# Patient Record
Sex: Male | Born: 1970
Health system: Southern US, Community
[De-identification: ages and names within clinical notes are randomized; demographics above are authoritative.]

## PROBLEM LIST (undated history)

## (undated) DIAGNOSIS — M549 Dorsalgia, unspecified: Secondary | ICD-10-CM

## (undated) DIAGNOSIS — D649 Anemia, unspecified: Secondary | ICD-10-CM

## (undated) DIAGNOSIS — G4730 Sleep apnea, unspecified: Secondary | ICD-10-CM

## (undated) DIAGNOSIS — G8929 Other chronic pain: Secondary | ICD-10-CM

## (undated) DIAGNOSIS — F319 Bipolar disorder, unspecified: Secondary | ICD-10-CM

## (undated) DIAGNOSIS — T7840XA Allergy, unspecified, initial encounter: Secondary | ICD-10-CM

## (undated) DIAGNOSIS — Z5189 Encounter for other specified aftercare: Secondary | ICD-10-CM

## (undated) HISTORY — DX: Anemia, unspecified: D64.9

## (undated) HISTORY — DX: Bipolar disorder, unspecified: F31.9

## (undated) HISTORY — DX: Other chronic pain: G89.29

## (undated) HISTORY — DX: Dorsalgia, unspecified: M54.9

## (undated) HISTORY — DX: Sleep apnea, unspecified: G47.30

## (undated) HISTORY — DX: Encounter for other specified aftercare: Z51.89

## (undated) HISTORY — DX: Allergy, unspecified, initial encounter: T78.40XA

---

## 2010-08-15 ENCOUNTER — Encounter: Payer: Self-pay | Admitting: Physician Assistant

## 2010-08-30 ENCOUNTER — Other Ambulatory Visit: Payer: Self-pay | Admitting: Orthopedic Surgery

## 2010-08-30 DIAGNOSIS — M79605 Pain in left leg: Secondary | ICD-10-CM

## 2010-08-30 DIAGNOSIS — M545 Low back pain: Secondary | ICD-10-CM

## 2010-08-31 ENCOUNTER — Ambulatory Visit
Admission: RE | Admit: 2010-08-31 | Discharge: 2010-08-31 | Disposition: A | Discharge status: Home / Self Care | Payer: 59 | Source: Ambulatory Visit | Attending: Orthopedic Surgery | Admitting: Orthopedic Surgery

## 2010-08-31 DIAGNOSIS — M545 Low back pain: Secondary | ICD-10-CM

## 2010-08-31 DIAGNOSIS — M79605 Pain in left leg: Secondary | ICD-10-CM

## 2012-01-05 ENCOUNTER — Emergency Department (HOSPITAL_BASED_OUTPATIENT_CLINIC_OR_DEPARTMENT_OTHER)
Admission: EM | Admit: 2012-01-05 | Discharge: 2012-01-05 | Disposition: A | Discharge status: Home / Self Care | Payer: Medicare Other | Attending: Emergency Medicine | Admitting: Emergency Medicine

## 2012-01-05 ENCOUNTER — Encounter (HOSPITAL_BASED_OUTPATIENT_CLINIC_OR_DEPARTMENT_OTHER): Payer: Self-pay | Admitting: Student

## 2012-01-05 DIAGNOSIS — R21 Rash and other nonspecific skin eruption: Secondary | ICD-10-CM | POA: Insufficient documentation

## 2012-01-05 DIAGNOSIS — L259 Unspecified contact dermatitis, unspecified cause: Secondary | ICD-10-CM

## 2012-01-05 MED ORDER — PREDNISONE 10 MG PO TABS: 20.0000 mg | ORAL_TABLET | Freq: Two times a day (BID) | ORAL | Status: DC

## 2012-01-05 MED ORDER — HYDROXYZINE HCL 25 MG PO TABS: 25.0000 mg | ORAL_TABLET | Freq: Four times a day (QID) | ORAL | Status: AC

## 2012-01-05 NOTE — Discharge Instructions (Signed)

## 2012-01-05 NOTE — ED Notes (Signed)
Poison ivy to both arms

## 2012-01-05 NOTE — ED Provider Notes (Signed)
History     CSN: [id number]  Arrival date & time 01/05/12  1044   First MD Initiated Contact with Patient 01/05/12 1057      Chief Complaint  Patient presents with  . Poison Ivy    (Consider location/radiation/quality/duration/timing/severity/associated sxs/prior treatment) HPI Comments: Was removing vines from side of house, now with rash and itching to arms, face.  Patient is a 41 y.o. male presenting with Poison Ivy. The history is provided by the patient.  Poison [place] This is a new problem. The current episode started 2 days ago. The problem occurs constantly. The problem has been rapidly worsening. Pertinent negatives include no shortness of breath. The symptoms are aggravated by nothing. The symptoms are relieved by nothing. Treatments tried: benadryl and hc cream. The treatment provided no relief.    History reviewed. No pertinent past medical history.  History reviewed. No pertinent past surgical history.  History reviewed. No pertinent family history.  History  Substance Use Topics  . Smoking status: Never Smoker   . Smokeless tobacco: Not on file  . Alcohol Use: No      Review of Systems  Respiratory: Negative for shortness of breath.   All other systems reviewed and are negative.    Allergies  Review of patient's allergies indicates no known allergies.  Home Medications  No current outpatient prescriptions on file.  BP 117/83  Pulse 70  Temp(Src) 97.7 F (36.5 C) (Oral)  Resp 18  SpO2 100%  Physical Exam  Nursing note and vitals reviewed. Constitutional: He is oriented to person, place, and time. He appears well-developed and well-nourished.  HENT:  Head: Normocephalic and atraumatic.  Neck: Normal range of motion. Neck supple.  Musculoskeletal: Normal range of motion.  Neurological: He is alert and oriented to person, place, and time.  Skin: Skin is warm and dry. He is not diaphoretic.       There is a macular rash to both forearms and face.       ED Course  Procedures (including critical care time)  Labs Reviewed - No data to display No results found.   No diagnosis found.    MDM  Will treat with prednisone and hydroxyzine.        Geoffery Lyons, MD 01/05/12 1106

## 2012-05-31 ENCOUNTER — Emergency Department (HOSPITAL_BASED_OUTPATIENT_CLINIC_OR_DEPARTMENT_OTHER): Payer: Medicare Other

## 2012-05-31 ENCOUNTER — Encounter (HOSPITAL_BASED_OUTPATIENT_CLINIC_OR_DEPARTMENT_OTHER): Payer: Self-pay

## 2012-05-31 ENCOUNTER — Emergency Department (HOSPITAL_BASED_OUTPATIENT_CLINIC_OR_DEPARTMENT_OTHER)
Admission: EM | Admit: 2012-05-31 | Discharge: 2012-05-31 | Disposition: A | Discharge status: Home / Self Care | Payer: Medicare Other | Attending: Emergency Medicine | Admitting: Emergency Medicine

## 2012-05-31 DIAGNOSIS — R109 Unspecified abdominal pain: Secondary | ICD-10-CM | POA: Insufficient documentation

## 2012-05-31 DIAGNOSIS — M25569 Pain in unspecified knee: Secondary | ICD-10-CM | POA: Diagnosis not present

## 2012-05-31 DIAGNOSIS — K7689 Other specified diseases of liver: Secondary | ICD-10-CM | POA: Diagnosis not present

## 2012-05-31 DIAGNOSIS — D7389 Other diseases of spleen: Secondary | ICD-10-CM | POA: Diagnosis not present

## 2012-05-31 LAB — CBC WITH DIFFERENTIAL/PLATELET
Basophils Relative: 0 % (ref 0–1)
Eosinophils Absolute: 0 10*3/uL (ref 0.0–0.7)
HCT: 40.8 % (ref 39.0–52.0)
Hemoglobin: 14.1 g/dL (ref 13.0–17.0)
Lymphs Abs: 2.9 10*3/uL (ref 0.7–4.0)
MCH: 30.7 pg (ref 26.0–34.0)
MCHC: 34.6 g/dL (ref 30.0–36.0)
MCV: 88.7 fL (ref 78.0–100.0)
Monocytes Absolute: 0.3 10*3/uL (ref 0.1–1.0)
Monocytes Relative: 5 % (ref 3–12)

## 2012-05-31 LAB — BASIC METABOLIC PANEL
BUN: 6 mg/dL (ref 6–23)
Creatinine, Ser: 0.9 mg/dL (ref 0.50–1.35)
GFR calc Af Amer: >90 mL/min (ref >90)
GFR calc non Af Amer: >90 mL/min (ref >90)
Glucose, Bld: 93 mg/dL (ref 70–99)

## 2012-05-31 LAB — URINALYSIS, ROUTINE W REFLEX MICROSCOPIC
Ketones, ur: NEGATIVE mg/dL
Leukocytes, UA: NEGATIVE
Nitrite: NEGATIVE
Protein, ur: NEGATIVE mg/dL
Urobilinogen, UA: 1 mg/dL (ref 0.0–1.0)

## 2012-05-31 LAB — HEPATIC FUNCTION PANEL
ALT: 10 U/L (ref 0–53)
Bilirubin, Direct: 0.1 mg/dL (ref 0.0–0.3)
Indirect Bilirubin: 0.5 mg/dL (ref 0.3–0.9)
Total Bilirubin: 0.6 mg/dL (ref 0.3–1.2)

## 2012-05-31 MED ORDER — KETOROLAC TROMETHAMINE 30 MG/ML IJ SOLN
30.0000 mg | Freq: Once | INTRAMUSCULAR | Status: AC
Start: 2012-05-31 — End: 2012-05-31
  Administered 2012-05-31: 30 mg via INTRAVENOUS
  Filled 2012-05-31: qty 1

## 2012-05-31 MED ORDER — ACETAMINOPHEN-CODEINE #3 300-30 MG PO TABS: 1.0000 | ORAL_TABLET | Freq: Four times a day (QID) | ORAL | Status: DC | PRN

## 2012-05-31 MED ORDER — GI COCKTAIL ~~LOC~~
30.0000 mL | Freq: Once | ORAL | Status: AC
  Administered 2012-05-31: 30 mL via ORAL
  Filled 2012-05-31: qty 30

## 2012-05-31 MED ORDER — IBUPROFEN 600 MG PO TABS: 600.0000 mg | ORAL_TABLET | Freq: Four times a day (QID) | ORAL | Status: DC | PRN

## 2012-05-31 MED ORDER — SODIUM CHLORIDE 0.9 % IV BOLUS (SEPSIS)
1000.0000 mL | Freq: Once | INTRAVENOUS | Status: AC
  Administered 2012-05-31: 1000 mL via INTRAVENOUS

## 2012-05-31 NOTE — ED Notes (Signed)
Pt reports intermittent right flank and abdominal pain x 1 week. He also reports left knee pain.

## 2012-05-31 NOTE — ED Provider Notes (Signed)
History     CSN: [id number]  Arrival date & time 05/31/12  1432   First MD Initiated Contact with Patient 05/31/12 1516      Chief Complaint  Patient presents with  . Abdominal Pain  . Knee Pain    (Consider location/radiation/quality/duration/timing/severity/associated sxs/prior treatment) HPI Comments: Pt with no known medical or surgical hx comes in with cc of abd pain and knee pain. Pt has right sided abd pain x 1-2 weeks. The pain is intermittent, but when he has it, it is extremelt disabling. The pain is described as colicky pain and sharp - stabbing type pain. No associated n/v/f/c/diarrhea. No UTI like sx. No hx of renal stones. Pt also has been having left knee pain - also intermittent, usually provoked with walking. No trauma precipitating it. No hx of knee pathology.  Patient is a 41 y.o. male presenting with abdominal pain and knee pain. The history is provided by the patient.  Abdominal Pain The primary symptoms of the illness include abdominal pain. The primary symptoms of the illness do not include fever, shortness of breath, nausea, vomiting, diarrhea or dysuria.  Symptoms associated with the illness do not include chills.  Knee Pain Associated symptoms include abdominal pain. Pertinent negatives include no chest pain, no headaches and no shortness of breath.    History reviewed. No pertinent past medical history.  History reviewed. No pertinent past surgical history.  No family history on file.  History  Substance Use Topics  . Smoking status: Never Smoker   . Smokeless tobacco: Not on file  . Alcohol Use: No      Review of Systems  Constitutional: Negative for fever, chills and activity change.  HENT: Negative for neck pain.   Eyes: Negative for visual disturbance.  Respiratory: Negative for cough, chest tightness and shortness of breath.   Cardiovascular: Negative for chest pain.  Gastrointestinal: Positive for abdominal pain. Negative for nausea,  vomiting, diarrhea, abdominal distention and rectal pain.  Genitourinary: Negative for dysuria, enuresis and difficulty urinating.  Musculoskeletal: Positive for arthralgias.  Neurological: Negative for dizziness, light-headedness and headaches.  Psychiatric/Behavioral: Negative for confusion.    Allergies  Review of patient's allergies indicates no known allergies.  Home Medications  No current outpatient prescriptions on file.  BP 128/83  Pulse 57  Temp 97.8 F (36.6 C) (Oral)  Resp 18  Ht 5\' 11"  (1.803 m)  Wt 200 lb (90.719 kg)  BMI 27.89 kg/m2  SpO2 100%  Physical Exam  Nursing note and vitals reviewed. Constitutional: He is oriented to person, place, and time. He appears well-developed.  HENT:  Head: Normocephalic and atraumatic.  Eyes: Conjunctivae normal and EOM are normal. Pupils are equal, round, and reactive to light.  Neck: Normal range of motion. Neck supple.  Cardiovascular: Normal rate and regular rhythm.   Pulmonary/Chest: Effort normal and breath sounds normal.  Abdominal: Soft. Bowel sounds are normal. He exhibits no distension. There is no tenderness. There is no rebound and no guarding.  Musculoskeletal:       Pt has no knee swelling, no erythema, no tenderness with palpation, but with active extension and flexion - pt has some medial anterior and popliteal region. No calf tenderness.  Neurological: He is alert and oriented to person, place, and time.  Skin: Skin is warm.    ED Course  Procedures (including critical care time)  Labs Reviewed  URINALYSIS, ROUTINE W REFLEX MICROSCOPIC - Abnormal; Notable for the following:    APPearance CLOUDY (*)  All other components within normal limits  CBC WITH DIFFERENTIAL - Abnormal; Notable for the following:    Lymphocytes Relative 47 (*)     All other components within normal limits  BASIC METABOLIC PANEL  HEPATIC FUNCTION PANEL  LIPASE, BLOOD   No results found.   No diagnosis found.    MDM    Pt comes in with cc of abd pain and knee pain.  Abd pain - will get Ct renal stone protocol. Exam is non peritoneal otherwise. Will get basic labs as well. Pt is from [place], family hx is benign, he has no rash, weight loss, no family hx of IBD, no diarrhea. Knee pain - unsure what the cause is. We will get knee Xray to make sure there is no osteophytes. No concerns for infection.        Derwood Kaplan, MD 05/31/12 [phone]

## 2012-07-05 DIAGNOSIS — L02219 Cutaneous abscess of trunk, unspecified: Secondary | ICD-10-CM | POA: Diagnosis not present

## 2012-07-05 DIAGNOSIS — L03319 Cellulitis of trunk, unspecified: Secondary | ICD-10-CM | POA: Diagnosis not present

## 2012-07-05 DIAGNOSIS — R599 Enlarged lymph nodes, unspecified: Secondary | ICD-10-CM | POA: Diagnosis not present

## 2012-07-05 DIAGNOSIS — M25569 Pain in unspecified knee: Secondary | ICD-10-CM | POA: Diagnosis not present

## 2012-09-09 ENCOUNTER — Ambulatory Visit (INDEPENDENT_AMBULATORY_CARE_PROVIDER_SITE_OTHER): Payer: Medicare Other | Admitting: Emergency Medicine

## 2012-09-09 VITALS — BP 130/80 | HR 70 | Temp 98.4°F | Resp 18 | Ht 69.0 in | Wt 209.0 lb

## 2012-09-09 DIAGNOSIS — N498 Inflammatory disorders of other specified male genital organs: Secondary | ICD-10-CM

## 2012-09-09 DIAGNOSIS — L02219 Cutaneous abscess of trunk, unspecified: Secondary | ICD-10-CM

## 2012-09-09 DIAGNOSIS — N492 Inflammatory disorders of scrotum: Secondary | ICD-10-CM

## 2012-09-09 MED ORDER — DOXYCYCLINE HYCLATE 100 MG PO CAPS: 100.0000 mg | ORAL_CAPSULE | Freq: Two times a day (BID) | ORAL | Status: DC

## 2012-09-09 NOTE — Progress Notes (Signed)
[facility] [address] [place] [address] [phone]  Date:  09/09/2012   Name:  Kishaun Erekson   DOB:  1970/09/01   MRN:  [id number]  PCP:  No primary provider on file.    Chief Complaint: Rash   History of Present Illness:  Ryan [name] is a 42 y.o. very pleasant male patient who presents with the following:  Has numerous lesions axillae that are painful and tender.  No fever or chills.  Girlfriend has MRSA  There is no problem list on file for this patient.   Past Medical History  Diagnosis Date  . Allergy     History reviewed. No pertinent past surgical history.  History  Substance Use Topics  . Smoking status: Former Games developer  . Smokeless tobacco: Not on file  . Alcohol Use: No    No family history on file.  No Known Allergies  Medication list has been reviewed and updated.  No current outpatient prescriptions on file prior to visit.   No current facility-administered medications on file prior to visit.    Review of Systems:  As per HPI, otherwise negative.    Physical Examination: Filed Vitals:   09/09/12 1240  BP: 130/80  Pulse: 70  Temp: 98.4 F (36.9 C)  Resp: 18   Filed Vitals:   09/09/12 1240  Height: 5\' 9"  (1.753 m)  Weight: 209 lb (94.802 kg)   Body mass index is 30.85 kg/(m^2). Ideal Body Weight: Weight in (lb) to have BMI = 25: 168.9   GEN: WDWN, NAD, Non-toxic, Alert & Oriented x 3 HEENT: Atraumatic, Normocephalic.  Ears and Nose: No external deformity. EXTR: No clubbing/cyanosis/edema NEURO: Normal gait.  PSYCH: Normally interactive. Conversant. Not depressed or anxious appearing.  Calm demeanor.  AXILLAE:  Multiple abscesses Genitalia:  Single abscess left scrotum  Assessment and Plan: Abscesses trunk and scrotum Doxycycline Follow up as needed in two weeks  Carmelina Dane, MD

## 2012-11-06 ENCOUNTER — Ambulatory Visit (INDEPENDENT_AMBULATORY_CARE_PROVIDER_SITE_OTHER): Payer: Medicare Other | Admitting: Family Medicine

## 2012-11-06 VITALS — BP 150/92 | HR 70 | Temp 98.2°F | Resp 16 | Ht 69.75 in | Wt 208.0 lb

## 2012-11-06 DIAGNOSIS — L02439 Carbuncle of limb, unspecified: Secondary | ICD-10-CM | POA: Diagnosis not present

## 2012-11-06 DIAGNOSIS — M79609 Pain in unspecified limb: Secondary | ICD-10-CM | POA: Diagnosis not present

## 2012-11-06 DIAGNOSIS — L02429 Furuncle of limb, unspecified: Secondary | ICD-10-CM | POA: Diagnosis not present

## 2012-11-06 DIAGNOSIS — M79622 Pain in left upper arm: Secondary | ICD-10-CM

## 2012-11-06 DIAGNOSIS — L02432 Carbuncle of left axilla: Secondary | ICD-10-CM

## 2012-11-06 MED ORDER — DOXYCYCLINE HYCLATE 100 MG PO CAPS: 100.0000 mg | ORAL_CAPSULE | Freq: Two times a day (BID) | ORAL | Status: DC

## 2012-11-06 NOTE — Patient Instructions (Addendum)
Pain, axillary, left  Carbuncle of left axilla - Plan: doxycycline (VIBRAMYCIN) 100 MG capsule, Wound culture    1. RETURN ON Thursday, 4/10 FROM 5-8 FOR WOUND RECHECK. 2.  KEEP WOUND COVERED.  CHANGE BANDAGE 1-2 TIMES DAILY. 3.  START ANTIBIOTICS/DOXYCYCLINE.

## 2012-11-06 NOTE — Progress Notes (Signed)
   [address]   [place], [place]  [address]   [phone]  Subjective:    Patient ID: Ryan [name], male    DOB: 1970-09-04, 42 y.o.   MRN: [id number]  HPI This 42 y.o. male presents for evaluation of abscess.  Onset four days ago.  No fever/chills/sweats.   No drainage.  Painful.  Unable to sleep.  History of recurrent boils R axilla and scrotal area; Doxycycline prescribed and worked well.  No heat.  Nursing, Holiday representative; unemployed currently.  Recent boil upper back; was very painful.   Review of Systems  Constitutional: Negative for fever, chills, diaphoresis and fatigue.  Skin: Positive for color change and wound.        Past Medical History  Diagnosis Date  . Allergy   . Sleep apnea     History reviewed. No pertinent past surgical history.  Prior to Admission medications   Not on File    No Known Allergies  History   Social History  . Marital Status: Single    Spouse Name: N/A    Number of Children: N/A  . Years of Education: N/A   Occupational History  . Not on file.   Social History Main Topics  . Smoking status: Former Games developer  . Smokeless tobacco: Not on file  . Alcohol Use: No  . Drug Use: No  . Sexually Active: Yes    Birth Control/ Protection: Condom   Other Topics Concern  . Not on file   Social History Narrative   Marital : single; from [place]; moved to [place] in 1999.      Children:  2      Employment: Photographer; unemployed currently.          History reviewed. No pertinent family history.  Objective:   Physical Exam  Nursing note and vitals reviewed. Constitutional: He appears well-developed and well-nourished. No distress.  Skin: He is not diaphoretic. There is erythema.  L AXILLA:  1.5 CM DIAMETER PALPABLE AREA OF INDURATION ASSOCIATED WITH 3 CM AREA OF SURROUNDING ERYTHEMA; +MILD FLUCTUANTS.  NO PUSTULE OR VESICLES ASSOCIATED.     PROCEDURE:  VERBAL CONSENT OBTAINED; 4 CC OF LIDOCAINE WITH EPI 2% ADMINISTERED INTO  WOUND OF L AXILLA; 11 BLADE ADVANCED INTO WOUND; INCISION MADE; WHITE DRAINAGE EXPRESSED; WOUND CULTURE OBTAINED.  HEMOSTATS EXPLORED WOUND WITH MINIMAL DRAINAGE; PLAIN GAUZE ADVANCED INTO WOUND.  PT TOLERATED PROCEDURE WELL; BANDAGE APPLIED.  GOOD HEMOSTASIS.     Assessment & Plan:  Pain, axillary, left  Carbuncle of left axilla  1.  Pain L axillary:  New. Secondary to carbuncle.  Recommend Tylenol or Motrin. 2.  L axillary carbuncle/abscess:  New.  S/p I&D with packing placed.  Rx for Doxcycycline provided; send wound culture.  RTC 36 hours for packing removal, wound recheck.  Keep clean and covered.  Meds ordered this encounter  Medications  . doxycycline (VIBRAMYCIN) 100 MG capsule    Sig: Take 1 capsule (100 mg total) by mouth 2 (two) times daily.    Dispense:  20 capsule    Refill:  0

## 2012-11-08 LAB — WOUND CULTURE

## 2012-11-16 ENCOUNTER — Encounter: Payer: Self-pay | Admitting: Radiology

## 2014-11-25 DIAGNOSIS — G8929 Other chronic pain: Secondary | ICD-10-CM | POA: Diagnosis not present

## 2014-11-25 DIAGNOSIS — N508 Other specified disorders of male genital organs: Secondary | ICD-10-CM | POA: Diagnosis not present

## 2014-11-25 DIAGNOSIS — Z87891 Personal history of nicotine dependence: Secondary | ICD-10-CM | POA: Diagnosis not present

## 2014-11-25 DIAGNOSIS — N4340 Spermatocele of epididymis, unspecified: Secondary | ICD-10-CM | POA: Diagnosis not present

## 2014-11-25 NOTE — ED Notes (Signed)
Pt states that he noticed two lumps come up near right sided testicle two days ago, denies pain,

## 2014-11-26 ENCOUNTER — Encounter (HOSPITAL_BASED_OUTPATIENT_CLINIC_OR_DEPARTMENT_OTHER): Payer: Self-pay | Admitting: Emergency Medicine

## 2014-11-26 ENCOUNTER — Emergency Department (HOSPITAL_BASED_OUTPATIENT_CLINIC_OR_DEPARTMENT_OTHER)
Admission: EM | Admit: 2014-11-26 | Discharge: 2014-11-26 | Disposition: A | Discharge status: Home / Self Care | Payer: Medicare Other | Attending: Emergency Medicine | Admitting: Emergency Medicine

## 2014-11-26 ENCOUNTER — Emergency Department (HOSPITAL_BASED_OUTPATIENT_CLINIC_OR_DEPARTMENT_OTHER): Payer: Medicare Other

## 2014-11-26 DIAGNOSIS — N4340 Spermatocele of epididymis, unspecified: Secondary | ICD-10-CM

## 2014-11-26 DIAGNOSIS — N508 Other specified disorders of male genital organs: Secondary | ICD-10-CM | POA: Diagnosis not present

## 2014-11-26 DIAGNOSIS — R52 Pain, unspecified: Secondary | ICD-10-CM

## 2014-11-26 DIAGNOSIS — N50819 Testicular pain, unspecified: Secondary | ICD-10-CM

## 2014-11-26 LAB — URINE MICROSCOPIC-ADD ON

## 2014-11-26 LAB — URINALYSIS, ROUTINE W REFLEX MICROSCOPIC
BILIRUBIN URINE: NEGATIVE
Glucose, UA: NEGATIVE mg/dL
Hgb urine dipstick: NEGATIVE
KETONES UR: 15 mg/dL — AB
Leukocytes, UA: NEGATIVE
NITRITE: NEGATIVE
PH: 5.5 (ref 5.0–8.0)
PROTEIN: 30 mg/dL — AB
Specific Gravity, Urine: 1.031 — ABNORMAL HIGH (ref 1.005–1.030)
UROBILINOGEN UA: 0.2 mg/dL (ref 0.0–1.0)

## 2014-11-26 MED ORDER — IBUPROFEN 800 MG PO TABS
800.0000 mg | ORAL_TABLET | Freq: Once | ORAL | Status: AC
  Administered 2014-11-26: 800 mg via ORAL
  Filled 2014-11-26: qty 1

## 2014-11-26 MED ORDER — AZITHROMYCIN 1 G PO PACK
1.0000 g | PACK | Freq: Once | ORAL | Status: AC
  Administered 2014-11-26: 1 g via ORAL
  Filled 2014-11-26: qty 1

## 2014-11-26 MED ORDER — CEFTRIAXONE SODIUM 250 MG IJ SOLR
250.0000 mg | Freq: Once | INTRAMUSCULAR | Status: AC
  Administered 2014-11-26: 250 mg via INTRAMUSCULAR
  Filled 2014-11-26: qty 250

## 2014-11-26 NOTE — ED Notes (Signed)
Pt reports that he has had recent new partners, but uses condoms

## 2014-11-26 NOTE — ED Provider Notes (Signed)
CSN: [id number]     Arrival date & time 11/25/14  2350 History   First MD Initiated Contact with Patient 11/26/14 0015     Chief Complaint  Patient presents with  . Testicle Pain     (Consider location/radiation/quality/duration/timing/severity/associated sxs/prior Treatment) Patient is a 44 y.o. male presenting with testicular pain. The history is provided by the patient.  Testicle Pain This is a new problem. The current episode started more than 2 days ago. The problem occurs constantly. The problem has not changed since onset.Pertinent negatives include no chest pain, no abdominal pain, no headaches and no shortness of breath. Nothing aggravates the symptoms. Nothing relieves the symptoms. He has tried nothing for the symptoms. The treatment provided no relief.  Has had new partners but denies penile discharge.  Is a long distance truck driver  Past Medical History  Diagnosis Date  . Allergy   . Sleep apnea   . Chronic back pain     Lower back injury with coccyx dysfunction; disability.   History reviewed. No pertinent past surgical history. History reviewed. No pertinent family history. History  Substance Use Topics  . Smoking status: Former Research scientist (life sciences)  . Smokeless tobacco: Not on file  . Alcohol Use: No    Review of Systems  Respiratory: Negative for shortness of breath.   Cardiovascular: Negative for chest pain.  Gastrointestinal: Negative for abdominal pain.  Genitourinary: Positive for testicular pain. Negative for flank pain, discharge and genital sores.  Neurological: Negative for headaches.  All other systems reviewed and are negative.     Allergies  Review of patient's allergies indicates no known allergies.  Home Medications   Prior to Admission medications   Not on File   BP 110/97 mmHg  Pulse 78  Temp(Src) 98.8 F (37.1 C) (Oral)  Resp 18  Ht 5\' 11"  (1.803 m)  Wt 200 lb (90.719 kg)  BMI 27.91 kg/m2  SpO2 100% Physical Exam  Constitutional: He is  oriented to person, place, and time. He appears well-developed and well-nourished. No distress.  HENT:  Head: Normocephalic and atraumatic.  Mouth/Throat: Oropharynx is clear and moist.  Eyes: Conjunctivae are normal. Pupils are equal, round, and reactive to light.  Neck: Normal range of motion. Neck supple.  No cervical, supraclavicular, no axillary no popliteal LAN.  Isolated right groin node freely mobile.  No left groin LAN  Cardiovascular: Normal rate, regular rhythm and intact distal pulses.   Pulmonary/Chest: Effort normal and breath sounds normal. No respiratory distress. He has no wheezes. He has no rales.  Abdominal: Soft. Bowel sounds are normal. There is no tenderness. There is no rebound and no guarding.  Genitourinary:  Uncircumcised no abscesses scrotum is normal  Chaperone present  Musculoskeletal: Normal range of motion.  Lymphadenopathy:    He has no cervical adenopathy.  Neurological: He is alert and oriented to person, place, and time.  Skin: Skin is warm and dry.  Psychiatric: He has a normal mood and affect.    ED Course  Procedures (including critical care time) Labs Review Labs Reviewed  URINALYSIS, ROUTINE W REFLEX MICROSCOPIC - Abnormal; Notable for the following:    Color, Urine AMBER (*)    Specific Gravity, Urine 1.031 (*)    Ketones, ur 15 (*)    Protein, ur 30 (*)    All other components within normal limits  URINE MICROSCOPIC-ADD ON    Imaging Review US Scrotum  11/26/2014   CLINICAL DATA:  43 year old male with painful right scrotal lump  EXAM: SCROTAL ULTRASOUND  DOPPLER ULTRASOUND OF THE TESTICLES  TECHNIQUE: Complete ultrasound examination of the testicles, epididymis, and other scrotal structures was performed. Color and spectral Doppler ultrasound were also utilized to evaluate blood flow to the testicles.  COMPARISON:  Prior CT abdomen/pelvis 05/31/2012  FINDINGS: Right testicle  Measurements: 4.1 x 2.5 x 2.6 cm. No mass or microlithiasis  visualized.  Left testicle  Measurements: 4.3 x 2.4 x 2.9 cm. No mass or microlithiasis visualized.  Pulsed Doppler interrogation of both testes demonstrates normal low resistance arterial and venous waveforms bilaterally.  Right epididymis: Sonographically simple spermatocele versus epididymal cyst measures of 4 mm.  Left epididymis: Sonographically simple spermatocele versus epididymal cyst measures 5 mm.  Hydrocele:  Trace hydrocele bilaterally.  Varicocele:  None visualized.  Sonographic interrogation of the region of clinical concern in the right groin corresponds with hypoechoic and a mildly prominent superficial inguinal lymphadenopathy.  IMPRESSION: 1. No evidence of testicular torsion, mass or other acute abnormality. 2. The region of clinical concern corresponds with mildly prominent and hypervascular superficial inguinal lymphadenopathy. Lymphadenopathy is likely reactive given tenderness. 3. Small bilateral sonographically simple spermatoceles versus epididymal cysts.   Electronically Signed   By: Jacqulynn Cadet M.D.   On: 11/26/2014 01:05   [place] Art/ven Flow Abd Pelv Doppler  11/26/2014   CLINICAL DATA:  44 year old male with painful right scrotal lump  EXAM: SCROTAL ULTRASOUND  DOPPLER ULTRASOUND OF THE TESTICLES  TECHNIQUE: Complete ultrasound examination of the testicles, epididymis, and other scrotal structures was performed. Color and spectral Doppler ultrasound were also utilized to evaluate blood flow to the testicles.  COMPARISON:  Prior CT abdomen/pelvis 05/31/2012  FINDINGS: Right testicle  Measurements: 4.1 x 2.5 x 2.6 cm. No mass or microlithiasis visualized.  Left testicle  Measurements: 4.3 x 2.4 x 2.9 cm. No mass or microlithiasis visualized.  Pulsed Doppler interrogation of both testes demonstrates normal low resistance arterial and venous waveforms bilaterally.  Right epididymis: Sonographically simple spermatocele versus epididymal cyst measures of 4 mm.  Left epididymis:  Sonographically simple spermatocele versus epididymal cyst measures 5 mm.  Hydrocele:  Trace hydrocele bilaterally.  Varicocele:  None visualized.  Sonographic interrogation of the region of clinical concern in the right groin corresponds with hypoechoic and a mildly prominent superficial inguinal lymphadenopathy.  IMPRESSION: 1. No evidence of testicular torsion, mass or other acute abnormality. 2. The region of clinical concern corresponds with mildly prominent and hypervascular superficial inguinal lymphadenopathy. Lymphadenopathy is likely reactive given tenderness. 3. Small bilateral sonographically simple spermatoceles versus epididymal cysts.   Electronically Signed   By: Jacqulynn Cadet M.D.   On: 11/26/2014 01:05     EKG Interpretation None      MDM   Final diagnoses:  Pain  Testicular pain    Will treat for STI.  Has a spermatocele.  Will refer to urology.     Veatrice Kells, MD 11/26/14 [phone]

## 2015-01-12 ENCOUNTER — Ambulatory Visit: Payer: Medicare Other | Admitting: Family

## 2015-01-13 ENCOUNTER — Encounter: Payer: Self-pay | Admitting: Medical

## 2015-01-13 ENCOUNTER — Ambulatory Visit (INDEPENDENT_AMBULATORY_CARE_PROVIDER_SITE_OTHER): Payer: Medicare Other | Admitting: Medical

## 2015-01-13 VITALS — BP 120/83 | HR 81 | Temp 98.2°F | Ht 69.75 in | Wt 206.6 lb

## 2015-01-13 DIAGNOSIS — F319 Bipolar disorder, unspecified: Secondary | ICD-10-CM | POA: Insufficient documentation

## 2015-01-13 DIAGNOSIS — G4700 Insomnia, unspecified: Secondary | ICD-10-CM

## 2015-01-13 DIAGNOSIS — Z8659 Personal history of other mental and behavioral disorders: Secondary | ICD-10-CM | POA: Diagnosis not present

## 2015-01-13 DIAGNOSIS — J301 Allergic rhinitis due to pollen: Secondary | ICD-10-CM | POA: Diagnosis not present

## 2015-01-13 DIAGNOSIS — D649 Anemia, unspecified: Secondary | ICD-10-CM | POA: Diagnosis not present

## 2015-01-13 DIAGNOSIS — G4730 Sleep apnea, unspecified: Secondary | ICD-10-CM

## 2015-01-13 DIAGNOSIS — M549 Dorsalgia, unspecified: Secondary | ICD-10-CM | POA: Insufficient documentation

## 2015-01-13 DIAGNOSIS — M545 Low back pain: Secondary | ICD-10-CM

## 2015-01-13 DIAGNOSIS — J309 Allergic rhinitis, unspecified: Secondary | ICD-10-CM | POA: Insufficient documentation

## 2015-01-13 NOTE — Patient Instructions (Addendum)
For your allergies continue claritin when season comes.   For your sleep apnea. Would recommend referral to specialist again. Equipment and mask may be more comfortable now. And if 02 sats drop then you will constantly awaken.  Will get old records and ask you sign form today as well as release form.  If you have acute flare of bipolar then ED evaluation. I do think referral to specialist would be helpful to find medication that has least side effects.   For back pain will review old records and may need to get lumbar spine xray.  Follow up in 1wk or as needed. Will plan to get cbc. May get other labs as well.

## 2015-01-13 NOTE — Assessment & Plan Note (Signed)
Currently stable and not on medications. This is his primary issue per his report that led to disability. No homicidal or suicidal ideations. Does not report depression and does not appear manic as well today(Although speaking with him it does appear he has psychiatric condition). Pt has lost his disability. And I don't have records. I have form he request for [place] to fill out. I will  try to get records from [facility]. Also will try to get records from prior pcp. If I am unable to get mental health records due to privacy laws then may need to refer to psychiatrist. Since bipolar is his primary dx that led to his disability.

## 2015-01-13 NOTE — Assessment & Plan Note (Signed)
Plan to get cbc on next visit.

## 2015-01-13 NOTE — Assessment & Plan Note (Addendum)
For back pain will review old records and may need to get lumbar spine xray.  Note pt reports this is secondary reason he has disability.

## 2015-01-13 NOTE — Assessment & Plan Note (Signed)
For your allergies continue claritin when season comes

## 2015-01-13 NOTE — Assessment & Plan Note (Signed)
For your sleep apnea. Would recommend referral to specialist again. Equipment and mask may be more comfortable now. And if 02 sats drop then you will constantly awaken.

## 2015-01-13 NOTE — Progress Notes (Signed)
Subjective:    Patient ID: Ryan [name], male    DOB: May 01, 1971, 44 y.o.   MRN: [id number]  HPI  I have reviewed pt PMH, PSH, FH, Social History and Surgical History  Allergies- spring and fall. claritin when has.  Blood transfusion- When he was very young had malnutrition and got transfusion.  Anemia- when younger. 3 years ago his hb/hct looked good.  Sleep apnea- Pt dx 2007.(Pt had pulmonologist in the past.) Pt stopped using his machine in the past. He did not feel like he slept better with the machine.   Insomia- has problems going to sleep and staying asleep.  Back pain- years ago and some today.- Coccyx region pain. He states dx with tear in region.  Pt has no surgeries.  Former very light smoker 44 yo.  Pt also has bipolar disorder. Pt sees [facility]. Facility was  In [place]. Pt not on any medications presently. Pt was off medications for a while. But in past medications were expensive. And he would gain weight. He can't remember what medication he was on.      Pt not working. 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   Pt also has some forms regarding social security and disabilitiy. Pt had disability. Dx that qualified him was bipolar and degnerative back disease. Pt former MD was  Dr. Harrington Challenger.  He states his formrer pcp retired. He has paperwork to be filled out. The paper work states he was supposed to have this all filled out by May 2016 or he would loose disability. In fact he tells me he has already lost his funds.          Review of Systems  Constitutional: Negative for fever, chills and fatigue.  Respiratory: Negative for cough, shortness of breath and wheezing.   Genitourinary: Negative for urgency, frequency, hematuria, flank pain, enuresis and difficulty urinating.  Musculoskeletal: Positive for back pain.  Neurological: Negative for weakness and numbness.  Hematological: Negative for  adenopathy. Does not bruise/bleed easily.  Psychiatric/Behavioral: Positive for sleep disturbance. Negative for hallucinations, behavioral problems, dysphoric mood, decreased concentration and agitation. The patient is not nervous/anxious.        No suicidal ideations.       Objective:   Physical Exam  General  Mental Status - Alert. General Appearance - Well groomed. Not in acute distress.  Skin Rashes- No Rashes.  HEENT Head- Normal. Ear Auditory Canal - Left- Normal. Right - Normal.Tympanic Membrane- Left- Normal. Right- Normal. Eye Sclera/Conjunctiva- Left- Normal. Right- Normal. Nose & Sinuses Nasal Mucosa- Left-  Not oggy or Congested. Right-  Not  boggy or Congested. Mouth & Throat Lips: Upper Lip- Normal: no dryness, cracking, pallor, cyanosis, or vesicular eruption. Lower Lip-Normal: no dryness, cracking, pallor, cyanosis or vesicular eruption. Buccal Mucosa- Bilateral- No Aphthous ulcers. Oropharynx- No Discharge or Erythema. Tonsils: Characteristics- Bilateral- No Erythema or Congestion. Size/Enlargement- Bilateral- No enlargement. Discharge- bilateral-None.  Neck Neck- Supple. No Masses.   Chest and Lung Exam Auscultation: Breath Sounds:- even and unlabored, but bilateral upper lobe rhonchi.  Cardiovascular Auscultation:Rythm- Regular, rate and rhythm. Murmurs & Other Heart Sounds:Ausculatation of the heart reveal- No Murmurs.  Lymphatic Head & Neck General Head & Neck Lymphatics: Bilateral: Description- No Localized lymphadenopathy.    Back Mid lumbar spine tenderness to palpation. Pain on straight leg lift. Pain on lateral movements and flexion/extension of the spine.  Lower ext neurologic  L5-S1 sensation intact bilaterally. Normal patellar reflexes bilaterally. No foot drop bilaterally.       Assessment & Plan:  Pt benefits have been stopped. He is trying to get his benefits restarted.

## 2015-01-13 NOTE — Progress Notes (Signed)
Pre visit review using our clinic review tool, if applicable. No additional management support is needed unless otherwise documented below in the visit note. 

## 2015-01-28 ENCOUNTER — Encounter: Payer: Self-pay | Admitting: Medical

## 2015-01-28 ENCOUNTER — Ambulatory Visit (INDEPENDENT_AMBULATORY_CARE_PROVIDER_SITE_OTHER): Payer: Medicare Other | Admitting: Medical

## 2015-01-28 VITALS — BP 147/91 | HR 57 | Temp 98.0°F | Ht 69.75 in | Wt 207.6 lb

## 2015-01-28 DIAGNOSIS — R030 Elevated blood-pressure reading, without diagnosis of hypertension: Secondary | ICD-10-CM

## 2015-01-28 DIAGNOSIS — IMO0001 Reserved for inherently not codable concepts without codable children: Secondary | ICD-10-CM

## 2015-01-28 DIAGNOSIS — Z8659 Personal history of other mental and behavioral disorders: Secondary | ICD-10-CM

## 2015-01-28 DIAGNOSIS — Z862 Personal history of diseases of the blood and blood-forming organs and certain disorders involving the immune mechanism: Secondary | ICD-10-CM

## 2015-01-28 LAB — COMPREHENSIVE METABOLIC PANEL WITH GFR
ALT: 17 U/L (ref 0–53)
AST: 22 U/L (ref 0–37)
Albumin: 4.2 g/dL (ref 3.5–5.2)
Alkaline Phosphatase: 52 U/L (ref 39–117)
BUN: 9 mg/dL (ref 6–23)
CO2: 26 meq/L (ref 19–32)
Calcium: 9.7 mg/dL (ref 8.4–10.5)
Chloride: 103 meq/L (ref 96–112)
Creatinine, Ser: 0.96 mg/dL (ref 0.40–1.50)
GFR: 91.41 mL/min
Glucose, Bld: 92 mg/dL (ref 70–99)
Potassium: 3.7 meq/L (ref 3.5–5.1)
Sodium: 139 meq/L (ref 135–145)
Total Bilirubin: 0.6 mg/dL (ref 0.2–1.2)
Total Protein: 8.2 g/dL (ref 6.0–8.3)

## 2015-01-28 LAB — CBC WITH DIFFERENTIAL/PLATELET
Basophils Absolute: 0 K/uL (ref 0.0–0.1)
Basophils Relative: 0.4 % (ref 0.0–3.0)
Eosinophils Absolute: 0 K/uL (ref 0.0–0.7)
Eosinophils Relative: 0.7 % (ref 0.0–5.0)
HCT: 46.4 % (ref 39.0–52.0)
Hemoglobin: 15.1 g/dL (ref 13.0–17.0)
Lymphocytes Relative: 34.1 % (ref 12.0–46.0)
Lymphs Abs: 2.3 K/uL (ref 0.7–4.0)
MCHC: 32.6 g/dL (ref 30.0–36.0)
MCV: 96.5 fl (ref 78.0–100.0)
Monocytes Absolute: 0.3 K/uL (ref 0.1–1.0)
Monocytes Relative: 4.7 % (ref 3.0–12.0)
Neutro Abs: 4 K/uL (ref 1.4–7.7)
Neutrophils Relative %: 60.1 % (ref 43.0–77.0)
Platelets: 444 K/uL — ABNORMAL HIGH (ref 150.0–400.0)
RBC: 4.81 Mil/uL (ref 4.22–5.81)
RDW: 14.4 % (ref 11.5–15.5)
WBC: 6.7 K/uL (ref 4.0–10.5)

## 2015-01-28 NOTE — Assessment & Plan Note (Signed)
You appear stable today. But I do want to get you in with psychiatrist again. I gave you [facility] information but I also gave you psychiatrist numbers for your to refer. I can try to refer you but often times psychiatrist office wants you to make the calls.   I talked with my supervising Physician and she is stating psychiatrist would have to fill out forms. She states you may need to contact disability lawyer to connect you with a disability Doctor.  I am willing to review your records and talk with your contact at disability/social security office regarding the process.

## 2015-01-28 NOTE — Patient Instructions (Addendum)
History of bipolar disorder You appear stable today. But I do want to get you in with psychiatrist again. I gave you [facility] information but I also gave you psychiatrist numbers for your to refer. I can try to refer you but often times psychiatrist office wants you to make the calls.   I talked with my supervising Physician and she is stating psychiatrist would have to fill out forms. She states you may need to contact disability lawyer to connect you with a disability Doctor.  I am willing to review your records and talk with your contact at disability/social security office regarding the process.  Then discuss this with my supervising physician  If you have an acute change in your mental status or mood then advise ED evaluation  For you blood pressure I want you to begin checkig bp daily and document reading. Low salt diet.   Follow up in one month for bp check.

## 2015-01-28 NOTE — Progress Notes (Signed)
Subjective:    Patient ID: Ryan [name], male    DOB: 02-Mar-1971, 44 y.o.   MRN: [id number]  HPI  Pt in for follow up. I don't have any information on his medical history as of yet. I don't have records from [facility]. Pt has history of disability. He lost his disability recently(His bipolar per his last note was the dx that qualified him for the disablity). Pt not on any medication currently for his bipolar disorder. No meds for years per his report.  Pt [facility] many years ago. During the exam he stated 20 yrs.  Pt was seeing psychiatrist every 3 years when he had disability. This was required. He states would see psychiatrist that government chose.  No cardiac or neuro signs or symptoms. His bp is more elevated than it was last time. He states from frustration dealing with trying to get disability back.   Review of Systems  Constitutional: Negative for fever, chills, diaphoresis, activity change and fatigue.  Respiratory: Negative for cough, chest tightness and shortness of breath.   Cardiovascular: Negative for chest pain, palpitations and leg swelling.  Gastrointestinal: Negative for nausea, vomiting and abdominal pain.  Musculoskeletal: Negative for neck pain and neck stiffness.  Neurological: Negative for dizziness, tremors, seizures, syncope, facial asymmetry, speech difficulty, weakness, light-headedness, numbness and headaches.  Psychiatric/Behavioral: Negative for suicidal ideas, hallucinations, behavioral problems, confusion, sleep disturbance, self-injury, decreased concentration and agitation. The patient is not nervous/anxious.        Not on any meds. States battle with mood(but not severe)    No homicidal or suicidal ideations.   Previously he had a lot side effects with meds. Gained a lot of weight.  He wants to consider being put on new meds. So I did try to refer him. I did not get his old records.     Past Medical History    Diagnosis Date  . Allergy   . Sleep apnea   . Chronic back pain     Lower back injury with coccyx dysfunction; disability.  . Anemia   . Blood transfusion without reported diagnosis     History   Social History  . Marital Status: Single    Spouse Name: N/A  . Number of Children: N/A  . Years of Education: N/A   Occupational History  . Not on file.   Social History Main Topics  . Smoking status: Former Research scientist (life sciences)  . Smokeless tobacco: Not on file  . Alcohol Use: No  . Drug Use: No  . Sexual Activity: Yes    Birth Control/ Protection: Condom   Other Topics Concern  . Not on file   Social History Narrative   Marital : single; from [place]; moved to [place] in 1999.      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          No past surgical history on file.  No family history on file.  No Known Allergies  No current outpatient prescriptions on file prior to visit.   No current facility-administered medications on file prior to visit.    BP 147/91 mmHg  Pulse 57  Temp(Src) 98 F (36.7 C) (Oral)  Ht 5' 9.75" (1.772 m)  Wt 207 lb 9.6 oz (94.167 kg)  BMI 29.99 kg/m2  SpO2 99%       Objective:   Physical Exam   General Mental  Status- Alert. General Appearance- Not in acute distress.   Skin General: Color- Normal Color. Moisture- Normal Moisture.  Neck Carotid Arteries- Normal color. Moisture- Normal Moisture. No carotid bruits. No JVD.  Chest and Lung Exam Auscultation: Breath Sounds:-Normal. CTA Cardiovascular Auscultation:Rythm- Regular, Rate and Rhythm. Murmurs & Other Heart Sounds:Auscultation of the heart reveals- No Murmurs.  Abdomen Inspection:-Inspeection Normal. Palpation/Percussion:Note:No mass. Palpation and Percussion of the abdomen reveal- Non Tender, Non Distended + BS, no rebound or guarding.    Neurologic Cranial Nerve exam:- CN III-XII intact(No  nystagmus), symmetric smile. Strength:- 5/5 equal and symmetric strength both upper and lower extremities.       Assessment & Plan:

## 2015-01-28 NOTE — Progress Notes (Signed)
Pre visit review using our clinic review tool, if applicable. No additional management support is needed unless otherwise documented below in the visit note. 

## 2015-02-01 ENCOUNTER — Telehealth: Payer: Self-pay | Admitting: Medical

## 2015-02-01 DIAGNOSIS — D473 Essential (hemorrhagic) thrombocythemia: Secondary | ICD-10-CM

## 2015-02-01 DIAGNOSIS — D75839 Thrombocytosis, unspecified: Secondary | ICD-10-CM

## 2015-02-01 NOTE — Telephone Encounter (Signed)
Repeat platlets due to elevation.

## 2015-02-02 NOTE — Telephone Encounter (Signed)
Left a message for call back.  

## 2015-02-03 NOTE — Telephone Encounter (Signed)
Lab appointment scheduled for/with patient.

## 2015-02-08 ENCOUNTER — Other Ambulatory Visit: Payer: Medicare Other

## 2015-03-01 ENCOUNTER — Telehealth: Payer: Self-pay | Admitting: Family

## 2015-03-01 ENCOUNTER — Encounter: Payer: Medicare Other | Admitting: Medical

## 2015-03-01 NOTE — Progress Notes (Signed)
This encounter was created in error - please disregard.

## 2015-03-01 NOTE — Telephone Encounter (Signed)
Relation to pt: self  Call back number:[phone]   Reason for call:   FYI-  Patient wanted PA to be informed he is extremely frustrated office has not received records spoke with previous doctor office and they faxed it and mailed it to the wrong office ([address] [facility] site)  Dr. Lacinda Axon psychiatrist from [facility] will be faxing and mailing patient records to our office.   Medical records #  [phone]

## 2015-03-02 ENCOUNTER — Telehealth: Payer: Self-pay | Admitting: Medical

## 2015-03-02 NOTE — Telephone Encounter (Signed)
Have not received yet

## 2015-03-02 NOTE — Telephone Encounter (Signed)
Pt would like to be notified as soon as we come across his medical records. He said he needs to be notified one way or the other about them being received because we cannot provide appropriate care without the records.

## 2015-03-02 NOTE — Telephone Encounter (Signed)
Pt called back again. He has rescheduled for 03/05/15, FYI.

## 2015-03-02 NOTE — Telephone Encounter (Signed)
Pt called in stating that he is frustrated and asked to speak to [place]. Advised pt Tiffany was with another pt and asked how I could help. Pt raised his voice stating that he doesn't want to keep explaining things to different people. Advised pt that we have notes documented and I can look back regarding concerns that he has contacted office about before. Pt hung up the phone on me.  Pt was also no show 03/01/15 9:15am, notes in from [place] yesterday but no documentation for reason of no show. She states he pt did not give a reason but called in yesterday frustrated about medical records.  Please advise on how to handle the situation with pt. Please advise on charge for no show 03/01/15.

## 2015-03-03 NOTE — Telephone Encounter (Addendum)
Patient call back to check on the status of received records, advised patient when received will call best [phone].

## 2015-03-05 ENCOUNTER — Ambulatory Visit (INDEPENDENT_AMBULATORY_CARE_PROVIDER_SITE_OTHER): Payer: Medicare Other | Admitting: Medical

## 2015-03-05 ENCOUNTER — Encounter: Payer: Self-pay | Admitting: Medical

## 2015-03-05 VITALS — BP 138/85 | HR 70 | Temp 98.0°F | Ht 69.75 in | Wt 204.4 lb

## 2015-03-05 DIAGNOSIS — Z8659 Personal history of other mental and behavioral disorders: Secondary | ICD-10-CM | POA: Diagnosis not present

## 2015-03-05 NOTE — Progress Notes (Signed)
before

## 2015-03-05 NOTE — Patient Instructions (Addendum)
History of bipolar disorder Still waiting old reports. Will review records. Pt understands after reviewing records may need to again recommend referral to psychiatrist. We can provide copies of medication that we may prescribe for social security purposes. But per Social security will not need any forms filled out by  any provider. All pt will do is sign paperwork Social Security will send him.   Will discuss any potential treatment with Supervising Physician due to his diagnosis.  Follow up date to be determined after I get old records.

## 2015-03-05 NOTE — Progress Notes (Signed)
Subjective:    Patient ID: Ryan [name], male    DOB: 1970-11-13, 45 y.o.   MRN: [id number]  HPI  Pt in for follow up. Pt records were sent to other office. So next week we anticipate records may be in.  Pt has not established psychiatrist care yet(as I requested). Pt has talked with social security. Pt has figured a way to get his disability re-activated. He just needs to sign the forms. He does not remember meds he was on in the past. He was on meds about 3-4 years ago.   Pt does have periods of mania in past. Also some down periods. He has done this past 4 yrs. He meditates to help. Has not been on meds.  Pt thinks he may have only beenon one med in the past.     Review of Systems  Constitutional: Negative for fever, chills and fatigue.  Respiratory: Negative for cough, chest tightness, shortness of breath and wheezing.   Cardiovascular: Negative for chest pain and palpitations.  Neurological: Negative for dizziness, speech difficulty, weakness, numbness and headaches.  Hematological: Negative for adenopathy. Does not bruise/bleed easily.  Psychiatric/Behavioral: Positive for dysphoric mood. Negative for behavioral problems and confusion. The patient is not nervous/anxious.        At times mild mania and some depression. But stable.   Also at times temper issues.       Past Medical History  Diagnosis Date  . Allergy   . Sleep apnea   . Chronic back pain     Lower back injury with coccyx dysfunction; disability.  . Anemia   . Blood transfusion without reported diagnosis     History   Social History  . Marital Status: Single    Spouse Name: N/A  . Number of Children: N/A  . Years of Education: N/A   Occupational History  . Not on file.   Social History Main Topics  . Smoking status: Former Research scientist (life sciences)  . Smokeless tobacco: Not on file  . Alcohol Use: No  . Drug Use: No  . Sexual Activity: Yes    Birth Control/ Protection: Condom   Other Topics Concern  .  Not on file   Social History Narrative   Marital : single; from [place]; moved to [place] in 1999.      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          No past surgical history on file.  No family history on file.  No Known Allergies  No current outpatient prescriptions on file prior to visit.   No current facility-administered medications on file prior to visit.    BP 138/85 mmHg  Pulse 70  Temp(Src) 98 F (36.7 C) (Oral)  Ht 5' 9.75" (1.772 m)  Wt 204 lb 6.4 oz (92.715 kg)  BMI 29.53 kg/m2  SpO2 100%       Objective:   Physical Exam  General- No acute distress. Good mood today. Stable. Neck- Full range of motion, no jvd Lungs- Clear, even and unlabored. Heart- regular rate and rhythm. Neurologic- CNII- XII grossly intact.       Assessment & Plan:  History of bipolar disorder Still waiting old reports. Will review records. Pt understands after reviewing records may need to again recommend referral to psychiatrist. We can provide copies of medication that we may prescribe for social security purposes. But per Social security will not need  any forms filled out by  any provider. All pt will do is sign paperwork Social Security will send him.  Will discuss any potential treatment with Supervising Physician due to his diagnosis.  Follow up date to be determined after I get old records.

## 2015-03-05 NOTE — Assessment & Plan Note (Addendum)
Still waiting old reports. Will review records. Pt understands after reviewing records may need to again recommend referral to psychiatrist. We can provide copies of medication that we may prescribe for social security purposes. But per Social security will not need any forms filled out by  any provider. All pt will do is sign paperwork Social Security will send him.

## 2015-03-08 NOTE — Telephone Encounter (Signed)
Charge for no show 03/01/15?

## 2015-03-09 NOTE — Telephone Encounter (Signed)
No charge. 

## 2015-03-17 NOTE — Telephone Encounter (Signed)
I have still not seen these records.

## 2015-03-23 NOTE — Telephone Encounter (Signed)
Spoke with Crystal from [employer] Record Department and she stated confirmation has been received stating we received records via fax and records mailed. Crystal stated they only keep records for a certain amount of time and records will be eventually discarded soon due to the time period. Requested if records can be faxed again to [phone] and [phone] and she said yes.

## 2015-03-23 NOTE — Telephone Encounter (Addendum)
Relation to AJ:LUNG  Call back number:[phone] Pharmacy:  Reason for call:  Patient called checking on the status of medical records and explained to patient have not received records and i will Nutritional therapist of [facility] Records Department to find out the status. Patient sated ok and informed him I will follow up with him to let him know the status.

## 2015-03-23 NOTE — Telephone Encounter (Signed)
Medical records received and scan into patient chart. Please advise

## 2015-03-24 NOTE — Telephone Encounter (Signed)
Called patient advised records have been received.

## 2015-03-29 ENCOUNTER — Telehealth: Payer: Self-pay | Admitting: Medical

## 2015-03-29 ENCOUNTER — Encounter: Payer: Medicare Other | Admitting: Medical

## 2015-03-29 NOTE — Telephone Encounter (Signed)
Looks like coming tomorrow. No charge. If no show tomorrow then will charge.

## 2015-03-29 NOTE — Telephone Encounter (Signed)
Pt no show today 03/29/15 10:15am, 1 month f/u appt, 8/29 10:12am pt called to find out appt time, states he can be here close to 11:00am, advised pt he needs to reschedule and nothing avail today, pt states that he did not receive [place] and [place] he called to confirm time now, pt states that we should be able to see him a little late, advised pt to reschedule, rescheduled for 03/29/14 8:15am, charge for no show?

## 2015-03-29 NOTE — Progress Notes (Signed)
This encounter was created in error - please disregard.

## 2015-03-30 ENCOUNTER — Ambulatory Visit (INDEPENDENT_AMBULATORY_CARE_PROVIDER_SITE_OTHER): Payer: Medicare Other | Admitting: Medical

## 2015-03-30 ENCOUNTER — Encounter: Payer: Self-pay | Admitting: Medical

## 2015-03-30 VITALS — BP 144/88 | HR 63 | Temp 98.1°F | Ht 69.75 in | Wt 204.4 lb

## 2015-03-30 DIAGNOSIS — Z8659 Personal history of other mental and behavioral disorders: Secondary | ICD-10-CM

## 2015-03-30 DIAGNOSIS — F32A Depression, unspecified: Secondary | ICD-10-CM

## 2015-03-30 DIAGNOSIS — F329 Major depressive disorder, single episode, unspecified: Secondary | ICD-10-CM | POA: Diagnosis not present

## 2015-03-30 MED ORDER — SERTRALINE HCL 25 MG PO TABS: 25.0000 mg | ORAL_TABLET | Freq: Every day | ORAL | Status: DC

## 2015-03-30 NOTE — Progress Notes (Signed)
Pre visit review using our clinic review tool, if applicable. No additional management support is needed unless otherwise documented below in the visit note. 

## 2015-03-30 NOTE — Patient Instructions (Signed)
With your history of bipolar and your intermittent depression, I will prescribe you low dose sertraline. This may help with your anxiety as well.  Your condition appears stable presently but with the hx of bipolar and potential for fluctuating moods I do want you evaluated by psychiatrist to see if they think mood stabilizer would be beneficial. You have history of risperidal use as well. I don't think this is necessary presently but will get psychiatrist opinion.   I want to see you in 2 wks and see how you are feeling on the low dose sertraline.  Follow up sooner if needed

## 2015-03-30 NOTE — Progress Notes (Signed)
Subjective:    Patient ID: Ryan [name], male    DOB: 1970-11-30, 44 y.o.   MRN: [id number]  HPI  I have been waiting for records and finally have them. Follow up visit. I have records finally. Summary of those records show he has hx of bipolar dx and some prior use of marijuana. But he is no longer using marijuana.   At one point was on sertraline and he was on Risperdal.  He stopped marijuana since he did not want to break the law.  In 2003 he had been off of meds for several years.   Pt social security status. They just need to know what meds  he was using.   Pt has been off medication for a couple of years. Pt states his mood varies from day to day. Some days feels fine. Other days more depressed. On discussion. He is not reporting any psychotic thoughts or any delusion.  Pt at times feels anxious often. Occasional mild agitation but he states controlls these symptoms.   Review of Systems  Constitutional: Negative for fever, chills and fatigue.  Respiratory: Negative for cough, chest tightness, shortness of breath and wheezing.   Cardiovascular: Negative for chest pain and palpitations.  Musculoskeletal: Negative for back pain.  Skin: Negative for rash.  Hematological: Negative for adenopathy. Does not bruise/bleed easily.  Psychiatric/Behavioral: Positive for dysphoric mood. Negative for suicidal ideas, behavioral problems, confusion, sleep disturbance, self-injury and decreased concentration. The patient is nervous/anxious.     Past Medical History  Diagnosis Date  . Allergy   . Sleep apnea   . Chronic back pain     Lower back injury with coccyx dysfunction; disability.  . Anemia   . Blood transfusion without reported diagnosis     Social History   Social History  . Marital Status: Single    Spouse Name: N/A  . Number of Children: N/A  . Years of Education: N/A   Occupational History  . Not on file.   Social History Main Topics  . Smoking status: Former  Research scientist (life sciences)  . Smokeless tobacco: Not on file  . Alcohol Use: No  . Drug Use: No  . Sexual Activity: Yes    Birth Control/ Protection: Condom   Other Topics Concern  . Not on file   Social History Narrative   Marital : single; from [place]; moved to [place] in 1999.      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          No past surgical history on file.  No family history on file.  No Known Allergies  No current outpatient prescriptions on file prior to visit.   No current facility-administered medications on file prior to visit.    BP 144/88 mmHg  Pulse 63  Temp(Src) 98.1 F (36.7 C) (Oral)  Ht 5' 9.75" (1.772 m)  Wt 204 lb 6.4 oz (92.715 kg)  BMI 29.53 kg/m2  SpO2 100%       Objective:   Physical Exam  General Mental Status- Alert. General Appearance- Not in acute distress.   Skin General: Color- Normal Color. Moisture- Normal Moisture.  Neck Carotid Arteries- Normal color. Moisture- Normal Moisture. No carotid bruits. No JVD.  Chest and Lung Exam Auscultation: Breath Sounds:-Normal.  Cardiovascular Auscultation:Rythm- Regular. Murmurs & Other Heart Sounds:Auscultation of the heart reveals- No Murmurs.  Abdomen Inspection:-Inspeection Normal. Palpation/Percussion:Note:No mass. Palpation and Percussion of the  abdomen reveal- Non Tender, Non Distended + BS, no rebound or guarding.    Neurologic Cranial Nerve exam:- CN III-XII intact(No nystagmus), symmetric smile. Strength:- 5/5 equal and symmetric strength both upper and lower extremities.     Assessment & Plan:  With your history of bipolar and your intermittent depression, I will prescribe you low dose sertraline. This may help with your anxiety as well.  Your condition appears stable presently but with the hx of bipolar and potential for fluctuating moods I do want you evaluated by psychiatrist to see if they  think mood stabilizer would be beneficial. You have history of risperidal use as well. I don't think this is necessary presently but will get psychiatrist opinion.   I want to see you in 2 wks and see how you are feeling on the low dose sertraline.  Follow up sooner if needed

## 2015-04-16 ENCOUNTER — Telehealth: Payer: Self-pay | Admitting: Medical

## 2015-04-16 ENCOUNTER — Encounter: Payer: Medicare Other | Admitting: Medical

## 2015-04-16 NOTE — Progress Notes (Signed)
This encounter was created in error - please disregard.

## 2015-04-27 ENCOUNTER — Institutional Professional Consult (permissible substitution): Payer: Medicare Other | Admitting: Internal Medicine

## 2015-04-28 NOTE — Telephone Encounter (Signed)
charge 

## 2015-04-28 NOTE — Telephone Encounter (Signed)
Pt was no show 04/16/15 9:15am, follow up appt, pt has not rescheduled, charge for no show?

## 2015-05-17 ENCOUNTER — Encounter: Payer: Medicare Other | Admitting: Medical

## 2015-05-17 ENCOUNTER — Telehealth: Payer: Self-pay | Admitting: Medical

## 2015-05-17 DIAGNOSIS — Z0289 Encounter for other administrative examinations: Secondary | ICD-10-CM

## 2015-05-17 NOTE — Progress Notes (Signed)
This encounter was created in error - please disregard.

## 2015-05-19 NOTE — Telephone Encounter (Signed)
Pt was no show 05/17/15 10:15am, follow up appt, pt has not rescheduled, this is 4th no show since 02/08/15, charge or no charge?

## 2015-05-19 NOTE — Telephone Encounter (Signed)
charge 

## 2015-06-03 ENCOUNTER — Ambulatory Visit (INDEPENDENT_AMBULATORY_CARE_PROVIDER_SITE_OTHER): Payer: Medicare Other | Admitting: Medical

## 2015-06-03 ENCOUNTER — Encounter: Payer: Self-pay | Admitting: Medical

## 2015-06-03 VITALS — BP 122/80 | HR 76 | Temp 98.0°F | Ht 69.75 in | Wt 202.0 lb

## 2015-06-03 DIAGNOSIS — J069 Acute upper respiratory infection, unspecified: Secondary | ICD-10-CM | POA: Diagnosis not present

## 2015-06-03 DIAGNOSIS — Z8659 Personal history of other mental and behavioral disorders: Secondary | ICD-10-CM

## 2015-06-03 DIAGNOSIS — F329 Major depressive disorder, single episode, unspecified: Secondary | ICD-10-CM

## 2015-06-03 DIAGNOSIS — F32A Depression, unspecified: Secondary | ICD-10-CM

## 2015-06-03 MED ORDER — SERTRALINE HCL 25 MG PO TABS: 25.0000 mg | ORAL_TABLET | Freq: Every day | ORAL | Status: DC

## 2015-06-03 MED ORDER — BENZONATATE 100 MG PO CAPS: 100.0000 mg | ORAL_CAPSULE | Freq: Three times a day (TID) | ORAL | Status: DC | PRN

## 2015-06-03 MED ORDER — FLUTICASONE PROPIONATE 50 MCG/ACT NA SUSP: 2.0000 | Freq: Every day | NASAL | Status: DC

## 2015-06-03 NOTE — Patient Instructions (Addendum)
Your depression is stable. I will rx sertraline same dose. Will give refill for  2 month until you can see psychiatrist in December.(I strongly encourage you to see psychiatrist since you have hx of bipolar.)  You have Uri vs allergy signs or symptoms. I will rx flonase and benzonatate.  Follow up in 7 days or as needed any persisting uri symptoms.

## 2015-06-03 NOTE — Progress Notes (Signed)
Pre visit review using our clinic review tool, if applicable. No additional management support is needed unless otherwise documented below in the visit note. 

## 2015-06-03 NOTE — Progress Notes (Signed)
Subjective:    Patient ID: Ryan [name], male    DOB: Mar 02, 1971, 44 y.o.   MRN: [id number]  HPI  Pt in states doing well with his mood(feels better with sertraline)  . He only states he felt  Mild sad with death of relative. This was relative he was unable to see for years. But he attend relatives funeral. He visited [place] to see family.  Pt has been on sertraline(I rx'd med  last time).. No side effects. Pt has hx of bipolar.and intermittent depression. But he does not report any mania episodes.He has not been on medications for some time. Overall doing well with mood.  Pt does have appointment with psychiatrist in December. He missed his appointment since he was in [place].  Pt hs mild nasal congestion for one day.Some mild cough at night.   Review of Systems  Constitutional: Negative for fever, chills, diaphoresis, activity change and fatigue.  HENT: Positive for congestion and rhinorrhea. Negative for ear pain, facial swelling, postnasal drip, sinus pressure and sneezing.   Respiratory: Negative for cough, chest tightness and shortness of breath.   Cardiovascular: Negative for chest pain, palpitations and leg swelling.  Gastrointestinal: Negative for nausea, vomiting and abdominal pain.  Musculoskeletal: Negative for neck pain and neck stiffness.  Neurological: Negative for dizziness, seizures, syncope, speech difficulty, weakness, light-headedness and headaches.  Hematological: Negative for adenopathy. Does not bruise/bleed easily.  Psychiatric/Behavioral: Positive for dysphoric mood. Negative for suicidal ideas, hallucinations, behavioral problems, confusion, self-injury and agitation. The patient is not nervous/anxious.     Past Medical History  Diagnosis Date  . Allergy   . Sleep apnea   . Chronic back pain     Lower back injury with coccyx dysfunction; disability.  . Anemia   . Blood transfusion without reported diagnosis     Social History   Social History  .  Marital Status: Single    Spouse Name: N/A  . Number of Children: N/A  . Years of Education: N/A   Occupational History  . Not on file.   Social History Main Topics  . Smoking status: Former Research scientist (life sciences)  . Smokeless tobacco: Not on file  . Alcohol Use: No  . Drug Use: No  . Sexual Activity: Yes    Birth Control/ Protection: Condom   Other Topics Concern  . Not on file   Social History Narrative   Marital : single; from [place]; moved to [place] in 1999.      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          No past surgical history on file.  No family history on file.  No Known Allergies  Current Outpatient Prescriptions on File Prior to Visit  Medication Sig Dispense Refill  . sertraline (ZOLOFT) 25 MG tablet Take 1 tablet (25 mg total) by mouth at bedtime. 30 tablet 0   No current facility-administered medications on file prior to visit.    BP 122/80 mmHg  Pulse 76  Temp(Src) 98 F (36.7 C) (Oral)  Ht 5' 9.75" (1.772 m)  Wt 202 lb (91.627 kg)  BMI 29.18 kg/m2  SpO2 98%       Objective:   Physical Exam  General Mental Status- Alert. General Appearance- Not in acute distress.    HEENT Head- Normal. Ear Auditory Canal - Left- Normal. Right - Normal.Tympanic Membrane- Left- Normal. Right- faint pinkish red tm. Eye Sclera/Conjunctiva- Left- Normal.  Right- Normal. Nose & Sinuses Nasal Mucosa- Left-  Mild boggy + Congested. Right-  Mild  boggy + Congested. Mouth & Throat Lips: Upper Lip- Normal: no dryness, cracking, pallor, cyanosis, or vesicular eruption. Lower Lip-Normal: no dryness, cracking, pallor, cyanosis or vesicular eruption. Buccal Mucosa- Bilateral- No Aphthous ulcers. Oropharynx- No Discharge or Erythema. Tonsils: Characteristics- Bilateral- No Erythema or Congestion. Size/Enlargement- Bilateral- No enlargement. Discharge- bilateral-None.  Neck Neck- Supple. No  Masses.   Skin General: Color- Normal Color. Moisture- Normal Moisture.  Neck Carotid Arteries- Normal color. Moisture- Normal Moisture. No carotid bruits. No JVD.  Chest and Lung Exam Auscultation: Breath Sounds:-Normal.  Cardiovascular Auscultation:Rythm- Regular. Murmurs & Other Heart Sounds:Auscultation of the heart reveals- No Murmurs.  Abdomen Inspection:-Inspeection Normal. Palpation/Percussion:Note:No mass. Palpation and Percussion of the abdomen reveal- Non Tender, Non Distended + BS, no rebound or guarding.   Neurologic Cranial Nerve exam:- CN III-XII intact(No nystagmus), symmetric smile. Strength:- 5/5 equal and symmetric strength both upper and lower extremities.      Assessment & Plan:  Your depression is stable. I will rx sertraline same dose. Will give refill for 2 month until you can see psychiatrist in December.(I strongly encourage you to see psychiatrist since you have hx of bipolar.)  You have Uri vs allergy signs or symptoms. I will rx flonase and benzonatate.  Follow up in 7 days or as needed any persisting uri symptoms

## 2015-08-30 ENCOUNTER — Telehealth: Payer: Self-pay | Admitting: *Deleted

## 2015-08-30 NOTE — Telephone Encounter (Signed)
Received fax from [facility] for [place]; forwarded to [place] for scan/Email/SLS 01/30

## 2015-11-02 ENCOUNTER — Telehealth: Payer: Self-pay | Admitting: Medical

## 2015-11-02 ENCOUNTER — Ambulatory Visit (INDEPENDENT_AMBULATORY_CARE_PROVIDER_SITE_OTHER): Payer: Medicare Other | Admitting: Medical

## 2015-11-02 ENCOUNTER — Encounter: Payer: Self-pay | Admitting: Medical

## 2015-11-02 VITALS — BP 124/84 | HR 71 | Temp 98.1°F | Ht 69.75 in | Wt 206.0 lb

## 2015-11-02 DIAGNOSIS — F329 Major depressive disorder, single episode, unspecified: Secondary | ICD-10-CM

## 2015-11-02 DIAGNOSIS — F3131 Bipolar disorder, current episode depressed, mild: Secondary | ICD-10-CM

## 2015-11-02 DIAGNOSIS — Z8659 Personal history of other mental and behavioral disorders: Secondary | ICD-10-CM

## 2015-11-02 DIAGNOSIS — F32A Depression, unspecified: Secondary | ICD-10-CM

## 2015-11-02 MED ORDER — SERTRALINE HCL 25 MG PO TABS: 25.0000 mg | ORAL_TABLET | Freq: Every day | ORAL | Status: DC

## 2015-11-02 MED FILL — SERTRALINE HCL 25 MG TABLET: 25 | 30 days supply | Qty: 30 | Fill #0

## 2015-11-02 NOTE — Telephone Encounter (Signed)
Please refer to psychiatrist. Old list of practices did not get in him in. Will you talk with new practice that seems to work easier with [place] and patient.

## 2015-11-02 NOTE — Progress Notes (Signed)
Pre visit review using our clinic review tool, if applicable. No additional management support is needed unless otherwise documented below in the visit note. 

## 2015-11-02 NOTE — Telephone Encounter (Signed)
Only one I can refer directly to is [facility], referral faxed awaiting appt

## 2015-11-02 NOTE — Patient Instructions (Signed)
Will refill pt sertraline.  Pt has been unable to get in with psychiatrist. Will put in referral again. See if can help get him in.  Note I have evaluated pt and he had been receiving disability for his condition before I ever saw him. My general impression from reviewing his records and talking with him is that he may very well qualify for continued disability. However, I  Am only primary care provider. I always differ to specialist in such matters.  Follow up in one month or as needed

## 2015-11-02 NOTE — Progress Notes (Signed)
Subjective:    Patient ID: Ryan [name], male    DOB: September 17, 1970, 45 y.o.   MRN: [id number]  HPI  Pt in for follow up on his mood.  Pt states he may have some possible social security issues. He fears he may loose his disability and this has caused some stress. He has had some mild-modeate depressed mood around concern about maybe loosing disability.   He states Social security had questioned whether he should receive further disability based on my notes?? I have been trying refer him to specialist based on his history of bipolar and in addition to fact that when he first came to me had questions on disability forms(but was already on it in the past). I told him in the past they would need to base disability on prior psychiatrist who diagnosed him or new psychiatrist which will see him. I had given him name of various pscyhiatrist so he could set up appointment.  Pt tried to refer himself to psychiatrist. I gave him list of various psychiatrist. But  With every office he called he did not have success in scheduling appointment.   Pt has been out of sertraline recently. So he needs refill.   Pt mood is mild-moderate depressed at time recently(but today stable). Also  stressed about maybe loosing disability.     Review of Systems  Constitutional: Negative for fever, chills and fatigue.  Respiratory: Negative for cough, chest tightness, shortness of breath and wheezing.   Cardiovascular: Negative for chest pain and palpitations.  Gastrointestinal: Negative for abdominal pain.  Neurological: Negative for dizziness and headaches.  Hematological: Negative for adenopathy. Does not bruise/bleed easily.  Psychiatric/Behavioral: Positive for behavioral problems and dysphoric mood. Negative for suicidal ideas, confusion and agitation. The patient is not nervous/anxious.        See hpi.    Past Medical History  Diagnosis Date  . Allergy   . Sleep apnea   . Chronic back pain     Lower back  injury with coccyx dysfunction; disability.  . Anemia   . Blood transfusion without reported diagnosis     Social History   Social History  . Marital Status: Single    Spouse Name: N/A  . Number of Children: N/A  . Years of Education: N/A   Occupational History  . Not on file.   Social History Main Topics  . Smoking status: Former Research scientist (life sciences)  . Smokeless tobacco: Not on file  . Alcohol Use: No  . Drug Use: No  . Sexual Activity: Yes    Birth Control/ Protection: Condom   Other Topics Concern  . Not on file   Social History Narrative   Marital : single; from [place]; moved to [place] in 1999.      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          No past surgical history on file.  No family history on file.  No Known Allergies  Current Outpatient Prescriptions on File Prior to Visit  Medication Sig Dispense Refill  . sertraline (ZOLOFT) 25 MG tablet Take 1 tablet (25 mg total) by mouth at bedtime. 30 tablet 2   No current facility-administered medications on file prior to visit.    BP 124/84 mmHg  Pulse 71  Temp(Src) 98.1 F (36.7 C) (Oral)  Ht 5' 9.75" (1.772 m)  Wt 206 lb (93.441 kg)  BMI 29.76 kg/m2  SpO2  98%       Objective:   Physical Exam  General Mental Status- Alert. General Appearance- Not in acute distress.   Skin General: Color- Normal Color. Moisture- Normal Moisture.    Chest and Lung Exam Auscultation: Breath Sounds:-Normal.  Cardiovascular Auscultation:Rythm- Regular. Murmurs & Other Heart Sounds:Auscultation of the heart reveals- No Murmurs.  Abdomen Inspection:-Inspeection Normal. Palpation/Percussion:Note:No mass. Palpation and Percussion of the abdomen reveal- Non Tender, Non Distended + BS, no rebound or guarding.  Neurologic Cranial Nerve exam:- CN III-XII intact(No nystagmus), symmetric smile. Strength:- 5/5 equal and symmetric  strength both upper and lower extremities.      Assessment & Plan:  Will refill pt sertraline.  Pt has been unable to get in with psychiatrist. Will put in referral again. See if can help get him in.  Note I have evaluated pt and he had been receiving disability for his condition before I ever saw him. My general impression from reviewing his records and talking with him is that he may very well qualify for continued disability. However, I am only primary care provider. I always differ to specialist in such matters.  Follow up in one month or as needed

## 2015-11-16 ENCOUNTER — Telehealth: Payer: Self-pay | Admitting: *Deleted

## 2015-11-16 NOTE — Telephone Encounter (Signed)
Pt signed ROI received via mail from [employer]. Forwarded to [place] to scan/email to medical records. JG//CMA  

## 2015-12-06 ENCOUNTER — Telehealth: Payer: Self-pay | Admitting: Medical

## 2015-12-06 ENCOUNTER — Encounter: Payer: Self-pay | Admitting: Medical

## 2015-12-06 DIAGNOSIS — Z0289 Encounter for other administrative examinations: Secondary | ICD-10-CM

## 2015-12-06 NOTE — Progress Notes (Signed)
This encounter was created in error - please disregard.

## 2015-12-10 NOTE — Telephone Encounter (Signed)
Pt was no show 12/06/15 for follow up appt, 5th no show, charge or no charge?

## 2015-12-10 NOTE — Telephone Encounter (Signed)
charge 

## 2015-12-14 ENCOUNTER — Encounter: Payer: Self-pay | Admitting: Medical

## 2015-12-14 NOTE — Telephone Encounter (Signed)
Marked to charge and mailing no show letter °

## 2015-12-27 ENCOUNTER — Telehealth: Payer: Self-pay | Admitting: Medical

## 2015-12-27 NOTE — Telephone Encounter (Signed)
Reviewed that patient had 5 no shows in past year. Decided to dismiss patient for repeated no shows.

## 2016-01-03 ENCOUNTER — Telehealth: Payer: Self-pay | Admitting: Medical

## 2016-01-03 NOTE — Telephone Encounter (Signed)
Patient dismissed from [facility] by Mackie Pai PA-C , effective Dec 27, 2015. Dismissal letter sent out by certified / registered mail.  DAJ

## 2016-01-07 ENCOUNTER — Telehealth: Payer: Self-pay | Admitting: Medical

## 2016-01-07 NOTE — Telephone Encounter (Signed)
We sent out letter of dismissal but I am still marked as pt pcp. Will you correct that.

## 2016-01-07 NOTE — Telephone Encounter (Signed)
Received signed domestic return receipt verifying delivery of certified letter on [id number]. Article number [id number] fbg

## 2016-01-19 ENCOUNTER — Encounter (HOSPITAL_COMMUNITY): Payer: Self-pay | Admitting: Psychiatry

## 2016-01-19 ENCOUNTER — Encounter (INDEPENDENT_AMBULATORY_CARE_PROVIDER_SITE_OTHER): Payer: Self-pay

## 2016-01-19 ENCOUNTER — Ambulatory Visit (INDEPENDENT_AMBULATORY_CARE_PROVIDER_SITE_OTHER): Payer: Medicare Other | Admitting: Psychiatry

## 2016-01-19 VITALS — BP 122/70 | HR 61 | Ht 71.0 in | Wt 202.0 lb

## 2016-01-19 DIAGNOSIS — F3132 Bipolar disorder, current episode depressed, moderate: Secondary | ICD-10-CM

## 2016-01-19 MED ORDER — QUETIAPINE FUMARATE 50 MG PO TABS: 50.0000 mg | ORAL_TABLET | Freq: Every day | ORAL | Status: DC

## 2016-01-19 MED ORDER — SERTRALINE HCL 50 MG PO TABS: 50.0000 mg | ORAL_TABLET | Freq: Every day | ORAL | Status: DC

## 2016-01-19 MED FILL — SERTRALINE HCL 50 MG TABLET: 50 | 30 days supply | Qty: 30 | Fill #0

## 2016-01-19 MED FILL — QUETIAPINE FUMARATE 50 MG T: 50 | 30 days supply | Qty: 30 | Fill #0
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
## 2016-02-04 ENCOUNTER — Ambulatory Visit (INDEPENDENT_AMBULATORY_CARE_PROVIDER_SITE_OTHER): Payer: Medicare Other | Admitting: Psychiatry

## 2016-02-04 DIAGNOSIS — F3132 Bipolar disorder, current episode depressed, moderate: Secondary | ICD-10-CM

## 2016-02-22 ENCOUNTER — Ambulatory Visit (HOSPITAL_COMMUNITY): Payer: Self-pay | Admitting: Clinical

## 2016-02-23 ENCOUNTER — Ambulatory Visit (HOSPITAL_COMMUNITY): Payer: Self-pay | Admitting: Psychiatry

## 2016-03-07 ENCOUNTER — Encounter (HOSPITAL_COMMUNITY): Payer: Self-pay | Admitting: Clinical

## 2016-03-07 ENCOUNTER — Ambulatory Visit (INDEPENDENT_AMBULATORY_CARE_PROVIDER_SITE_OTHER): Payer: Medicare Other | Admitting: Clinical

## 2016-03-07 DIAGNOSIS — F3132 Bipolar disorder, current episode depressed, moderate: Secondary | ICD-10-CM

## 2016-03-08 NOTE — Progress Notes (Signed)
   THERAPIST PROGRESS NOTE  Session Time: 10:13 - 11:10  Participation Level: Active  Behavioral Response: CasualAlertDepressed   Type of Therapy: Individual Therapy   Treatment Goals addressed: improve psychiatric symptoms, elevate mood (increased self-esteem, decreased irritability, increased hopefulness), Improve unhelpful thought patterns, interpersonal  relationship skills, emotional regulation ( anger and stress management), control manic and depressive symptoms, learn about diagnosis, healthy coping skills   Interventions: CBT and Motivational Interviewing, Grounding and Mindfulness Techniques, psychoeducation   Summary: Casmere Hollenbeck is a 45  y.o. male who presents with Bipolar affective disorder, currently depressed, moderate    Suicidal/Homicidal: No -without intent/plan  Therapist Response: Buzz met with clinician for an individual session. Muhamed discussed his psychiatric symptoms and his current life events. Freedom shared that he has been depressed. He shared that he has not been taking his medication regularly because he does not like the side effects. Client and clinician discussed his diagnosis. Client and clinician discussed the importance of taking his medication to help decrease his symptoms. Clinician encouraged him to discuss his medication and their side effects with his psychiatrist. Clinician introduced some grounding and mindfulness techniques. Clinician explained the process, purpose and practice of the techniques. Client and clinician practiced two techniques together. Clinician introduced some basic cbt concepts. Client and clinician discussed the thought - emotion connection. Koden presented some examples of his negative thoughts. Clinician presented alternative thoughts and asked open ended questions about how his behavior might change if he held alternative thoughts. Client and clinician agreed to discuss the process of challenging thoughts in future sessions.  Clinician gave Tushar a homework packet on bipolar. Hargis agreed to practice his grounding and mindfulness techniques daily until next session. He also agreed to complete the homework packet and bring it with him to next session.    Plan: Return again in 1-2 weeks.   Diagnosis: Axis I: Bipolar affective disorder, currently depressed, moderate    Jayzon Taras A, LCSW 03/08/2016

## 2016-03-09 ENCOUNTER — Encounter (HOSPITAL_COMMUNITY): Payer: Self-pay | Admitting: Clinical

## 2016-03-28 ENCOUNTER — Ambulatory Visit (HOSPITAL_COMMUNITY): Payer: Self-pay | Admitting: Clinical

## 2016-04-06 ENCOUNTER — Encounter (HOSPITAL_COMMUNITY): Payer: Self-pay | Admitting: Psychiatry

## 2016-04-06 ENCOUNTER — Ambulatory Visit (INDEPENDENT_AMBULATORY_CARE_PROVIDER_SITE_OTHER): Payer: Medicare Other | Admitting: Psychiatry

## 2016-04-06 VITALS — BP 130/80 | HR 75 | Ht 71.0 in | Wt 190.0 lb

## 2016-04-06 DIAGNOSIS — G4700 Insomnia, unspecified: Secondary | ICD-10-CM | POA: Diagnosis not present

## 2016-04-06 DIAGNOSIS — F3132 Bipolar disorder, current episode depressed, moderate: Secondary | ICD-10-CM | POA: Diagnosis not present

## 2016-04-06 MED ORDER — QUETIAPINE FUMARATE 50 MG PO TABS: 50.0000 mg | ORAL_TABLET | Freq: Every day | ORAL | 2 refills | Status: DC

## 2016-04-06 MED ORDER — SERTRALINE HCL 100 MG PO TABS: 100.0000 mg | ORAL_TABLET | Freq: Every day | ORAL | 2 refills | Status: DC
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
## 2016-04-19 ENCOUNTER — Telehealth (HOSPITAL_COMMUNITY): Payer: Self-pay

## 2016-04-19 ENCOUNTER — Ambulatory Visit (INDEPENDENT_AMBULATORY_CARE_PROVIDER_SITE_OTHER): Payer: Medicare Other | Admitting: Clinical

## 2016-04-19 DIAGNOSIS — F3132 Bipolar disorder, current episode depressed, moderate: Secondary | ICD-10-CM

## 2016-04-19 NOTE — Telephone Encounter (Signed)
Ryan [name] picked up prescription on [id number]  lic  [phone]  dlo

## 2016-04-19 NOTE — Progress Notes (Signed)
   THERAPIST PROGRESS NOTE  Session Time: 12:03 - 1:00  Participation Level: Active  Behavioral Response: CasualAlertAnxious and Depressed  Type of Therapy: Individual Therapy  Treatment Goals addressed: improve psychiatric symptoms, elevate mood ( decreased irritability), Improve unhelpful thought patterns, interpersonal relationship skills,   learn about diagnosis  Interventions: CBT and Motivational Interviewing,  psychoeducation  Summary: Ryan [name] is a 44  y.o. male who presents with Bipolar affective disorder, currently depressed, moderate   Suicidal/Homicidal: No -without intent/plan  Therapist Response:  Ryan [name] met with [name] for an individual session. Ryan [name] discussed his psychiatric symptoms, his current life events. He shared that he continues to feel depressed. [name] asked open ended questions and he shared a bout his disappointment and his roommate and friend. Ryan [name] shared his negative automatic thoughts about them. [name] asked how he felt when he thought these thoughts he shared that it made him feel depressed and angry. [name] introduced a 7 panel thought record sheet. Ryan [name] identified the situation he identified and rated his feelings. He identified his negative automatic thoughts. [name] asked open ended questions and Ryan [name] identified the evidence for and against his negative automatic thoughts. Ryan [name] discussed healthier alternative thoughts. Ryan [name] that his negative automatic thoughts were formed due to his upbringing. He shared that he had a feeling that his upbringing was the right way to do things. Ryan [name] and [name] discussed how his thoughts affect his behavior which in turn affects his relationships. [name] explained unhelpful thinking styles. Ryan [name] and [name] discussed black-and-white thinking. Ryan [name] and [name] discussed the pros and cons of such thinking. Ryan [name] had the [name] that those thoughts keep him from  feeling happy. Ryan [name] and [name] reviewed his homework packet on bipolar. [name] gave him the next packet which she agreed to complete before next session.  Plan: Return again in 1-2 weeks.  Diagnosis: Axis I: Bipolar affective disorder, currently depressed, moderate    , A, LCSW 04/19/2016  

## 2016-04-27 ENCOUNTER — Encounter (HOSPITAL_COMMUNITY): Payer: Self-pay | Admitting: Clinical

## 2016-05-09 ENCOUNTER — Ambulatory Visit (HOSPITAL_COMMUNITY): Payer: Self-pay | Admitting: Clinical

## 2016-06-08 ENCOUNTER — Encounter (HOSPITAL_COMMUNITY): Payer: Self-pay | Admitting: Psychiatry

## 2016-06-08 ENCOUNTER — Ambulatory Visit (INDEPENDENT_AMBULATORY_CARE_PROVIDER_SITE_OTHER): Payer: Medicare Other | Admitting: Psychiatry

## 2016-06-08 DIAGNOSIS — F3132 Bipolar disorder, current episode depressed, moderate: Secondary | ICD-10-CM

## 2016-06-08 DIAGNOSIS — Z87891 Personal history of nicotine dependence: Secondary | ICD-10-CM

## 2016-06-08 MED ORDER — QUETIAPINE FUMARATE 50 MG PO TABS: 50.0000 mg | ORAL_TABLET | Freq: Every day | ORAL | 2 refills | Status: DC

## 2016-06-08 MED ORDER — SERTRALINE HCL 100 MG PO TABS
100.0000 mg | ORAL_TABLET | Freq: Every day | ORAL | 2 refills | Status: DC
Start: 2016-06-08 — End: 2016-10-03
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
## 2016-07-10 MED FILL — QUETIAPINE FUMARATE 50 MG T: 50 | 30 days supply | Qty: 30 | Fill #0

## 2016-07-10 MED FILL — SERTRALINE HCL 100 MG TAB: 100 | 30 days supply | Qty: 30 | Fill #0

## 2016-09-12 ENCOUNTER — Ambulatory Visit (HOSPITAL_COMMUNITY): Payer: Self-pay | Admitting: Psychiatry

## 2016-09-26 ENCOUNTER — Ambulatory Visit (HOSPITAL_COMMUNITY): Payer: Medicare Other | Admitting: Psychiatry

## 2016-09-26 ENCOUNTER — Ambulatory Visit (HOSPITAL_COMMUNITY): Payer: Self-pay | Admitting: Psychiatry

## 2016-09-27 ENCOUNTER — Ambulatory Visit (HOSPITAL_COMMUNITY): Payer: Medicare Other | Admitting: Psychiatry

## 2016-09-27 ENCOUNTER — Telehealth (HOSPITAL_COMMUNITY): Payer: Self-pay

## 2016-09-27 ENCOUNTER — Encounter (HOSPITAL_COMMUNITY): Payer: Self-pay

## 2016-09-27 NOTE — Telephone Encounter (Signed)
Medication management - Telephone call with patient twice today after he was late for an appointment 09/26/16 and rescheduled for today and then called out that he had missed his bus today.  Patient did not call out today until approximately 10:45am but was told on 09/26/16 to be at [facility] outpatient at 9:30am.  Patient was upset he would be receiving a bill for both missed appointments over the past 2 days.  Patient repeatedly told this nurse he had "issues" as the reason he comes into [facility] outpatient and we should work with him when late to be seen and more understanding.  Informed patient all other patients come for various reasons but that our provides schedules are extremely tight and they will typically not see patients more than 10 minutes late when scheduled for 15 minutes as a courtesy to all other scheduled patients.  Patient stated understanding this and after more discussion agreed to schedule patient one last time but informed patient he would have to be on time to be seen.  Requested patient tell this nurse the most appropriate time he would like to come in as states earlier in the day appointments are harder for him to make.  Patient stated understanding he would have to keep this appointment or would be billed and possibly discharged from services as he could not keep no showing or canceling and then just rescheduling.  Agreed to make sure patient not billed for 09/26/16 appointment and 09/27/16 to try to work with patient.  Both bills taken off by [facility], lead receptionist per this clinical managers request and patient scheduled for 1:15pm on Tuesday 10/03/16.  On second call to patient gave him the appointment after lunch as he requested and he agreed to be in at 1:00pm on 10/03/16 to see Dr. Adele Schilder.  Informed if patient is late for the appointment then it would be likely he would be canceled and billed.  Patient stated understanding plan and stated understanding after discussion why it is important for  providers to stay on schedule.  Patient to call back if any problems with current plan for appointment 10/03/16.

## 2016-10-03 ENCOUNTER — Encounter (HOSPITAL_COMMUNITY): Payer: Self-pay | Admitting: Psychiatry

## 2016-10-03 ENCOUNTER — Ambulatory Visit (INDEPENDENT_AMBULATORY_CARE_PROVIDER_SITE_OTHER): Payer: Medicare Other | Admitting: Psychiatry

## 2016-10-03 DIAGNOSIS — Z79899 Other long term (current) drug therapy: Secondary | ICD-10-CM

## 2016-10-03 DIAGNOSIS — F3132 Bipolar disorder, current episode depressed, moderate: Secondary | ICD-10-CM

## 2016-10-03 DIAGNOSIS — Z87891 Personal history of nicotine dependence: Secondary | ICD-10-CM

## 2016-10-03 MED ORDER — QUETIAPINE FUMARATE 50 MG PO TABS: 50.0000 mg | ORAL_TABLET | Freq: Every day | ORAL | 2 refills | Status: DC

## 2016-10-03 MED ORDER — SERTRALINE HCL 100 MG PO TABS: 100.0000 mg | ORAL_TABLET | Freq: Every day | ORAL | 2 refills | Status: DC
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
## 2016-10-12 ENCOUNTER — Ambulatory Visit (INDEPENDENT_AMBULATORY_CARE_PROVIDER_SITE_OTHER): Payer: Medicare Other

## 2016-10-12 ENCOUNTER — Ambulatory Visit (INDEPENDENT_AMBULATORY_CARE_PROVIDER_SITE_OTHER): Payer: Medicare Other | Admitting: Physician Assistant

## 2016-10-12 VITALS — BP 140/88 | HR 74 | Temp 98.3°F | Resp 16 | Ht 70.0 in | Wt 200.0 lb

## 2016-10-12 DIAGNOSIS — M5442 Lumbago with sciatica, left side: Secondary | ICD-10-CM | POA: Diagnosis not present

## 2016-10-12 DIAGNOSIS — G43809 Other migraine, not intractable, without status migrainosus: Secondary | ICD-10-CM

## 2016-10-12 DIAGNOSIS — M5136 Other intervertebral disc degeneration, lumbar region: Secondary | ICD-10-CM

## 2016-10-12 DIAGNOSIS — M545 Low back pain: Secondary | ICD-10-CM | POA: Diagnosis not present

## 2016-10-12 MED ORDER — CYCLOBENZAPRINE HCL 10 MG PO TABS: 5.0000 mg | ORAL_TABLET | Freq: Three times a day (TID) | ORAL | 1 refills | Status: DC | PRN

## 2016-10-12 MED ORDER — SUMATRIPTAN SUCCINATE 50 MG PO TABS: 25.0000 mg | ORAL_TABLET | ORAL | 0 refills | Status: AC | PRN

## 2016-10-12 MED ORDER — MELOXICAM 15 MG PO TABS: 15.0000 mg | ORAL_TABLET | Freq: Every day | ORAL | 1 refills | Status: DC

## 2016-10-12 MED FILL — SERTRALINE HCL 100 MG TAB: 100 | 30 days supply | Qty: 30 | Fill #0

## 2016-10-12 MED FILL — QUETIAPINE FUMARATE 50 MG T: 50 | 30 days supply | Qty: 30 | Fill #0

## 2016-10-12 NOTE — Progress Notes (Signed)
PRIMARY CARE AT [facility] [address] [place] [address] [phone]  Date:  10/12/2016   Name:  Chibuike Fleek   DOB:  July 29, 1971   MRN:  [id number]  PCP:  No PCP Per Patient    History of Present Illness:  Sie Formisano is a 46 y.o. male patient who presents to PCP with  Chief Complaint  Patient presents with  . Migraine    x2 weeks; hurts all over; denies any vision problems     Pt reports chronic back pain that has progressively worsened within the last.  Hurts at his lower back and radiates down his legs bilaterally.  No numbness or tingling, and no weakness.  Aggravated by rising and ambulating.  Sudden turning of the torso causes a sharp pain.  This will wake him from sleep.  He has taken "ganja tea" to help his symptoms.  Pain makes it difficult to sleep.  He has stopped taking his medication "because of the pain".  This includes the seroquel and the zoloft. He also complains of head pain that includes auras.  They occur several times per week and last for hours.  He will see spots prior to the head pain.  No nausea, photo/phonophobia.   No change in appetite.  Vegetarian, and includes protein. He has a hx of sleep apnea.  He has a sleep study next week for a machine.     Patient Active Problem List   Diagnosis Date Noted  . Allergic rhinitis 01/13/2015  . Sleep apnea 01/13/2015  . Anemia 01/13/2015  . Back pain 01/13/2015  . History of bipolar disorder 01/13/2015  . Insomnia w/ sleep apnea 01/13/2015    Past Medical History:  Diagnosis Date  . Allergy   . Anemia   . Bipolar disorder ([place])   . Blood transfusion without reported diagnosis   . Chronic back pain    Lower back injury with coccyx dysfunction; disability.  . Sleep apnea     History reviewed. No pertinent surgical history.  Social History  Substance Use Topics  . Smoking status: Former Research scientist (life sciences)  . Smokeless tobacco: Never Used  . Alcohol use No    History reviewed. No pertinent family  history.  No Known Allergies  Medication list has been reviewed and updated.  Current Outpatient Prescriptions on File Prior to Visit  Medication Sig Dispense Refill  . QUEtiapine (SEROQUEL) 50 MG tablet Take 1 tablet (50 mg total) by mouth at bedtime. 30 tablet 2  . sertraline (ZOLOFT) 100 MG tablet Take 1 tablet (100 mg total) by mouth at bedtime. 30 tablet 2   No current facility-administered medications on file prior to visit.     ROS ROS otherwise unremarkable unless listed above.  Physical Examination: BP 140/88   Pulse 74   Temp 98.3 F (36.8 C) (Oral)   Resp 16   Ht 5\' 10"  (1.778 m)   Wt 200 lb (90.7 kg)   SpO2 99%   BMI 28.70 kg/m  Ideal Body Weight: Weight in (lb) to have BMI = 25: 173.9  Physical Exam  Constitutional: He is oriented to person, place, and time. He appears well-developed and well-nourished. No distress.  HENT:  Head: Normocephalic and atraumatic.  Eyes: Conjunctivae and EOM are normal. Pupils are equal, round, and reactive to light.  Cardiovascular: Normal rate.   Pulmonary/Chest: Effort normal. No respiratory distress.  Musculoskeletal:       Lumbar back: He exhibits decreased range of motion (45 degrees flexion.  pain  incited with lateral deviation, and horizontal rotation) and bony tenderness (lower lumbar/sacral tenderness). He exhibits no swelling, no edema and no spasm.  Positive straight leg raise test bilaterally  Neurological: He is alert and oriented to person, place, and time.  Skin: Skin is warm and dry. He is not diaphoretic.  Psychiatric: He has a normal mood and affect. His behavior is normal.    Dg Lumbar Spine Complete  Result Date: 10/12/2016 CLINICAL DATA:  Intermittent low back pain EXAM: LUMBAR SPINE - COMPLETE 4+ VIEW COMPARISON:  None. FINDINGS: Five lumbar type vertebral bodies show normal alignment. There is mild disc space narrowing at L5-S1. Other disc heights are within normal limits. No evidence of facet arthropathy  or pars defect. No fracture or focal lesion. Sacroiliac joints appear normal. IMPRESSION: Normal except for mild disc space narrowing L5-S1. Electronically Signed   By: Nelson Chimes M.D.   On: 10/12/2016 15:54   Dg Sacrum/coccyx  Result Date: 10/12/2016 CLINICAL DATA:  Acute lumbosacral back pain. EXAM: SACRUM AND COCCYX - 2+ VIEW COMPARISON:  Lumbar radiographs same day FINDINGS: Sacrum and coccyx appear normal. Sacroiliac joints appear normal. Visualized portions of the pelvic girdle appear normal. Symphysis pubis is normal. IMPRESSION: Negative. Electronically Signed   By: Nelson Chimes M.D.   On: 10/12/2016 15:55    Assessment and Plan: Vontae Court is a 46 y.o. male who is here today for cc of low back pain and headache. Will refer to ortho at this time.  He has had prior hx with low back pain with [facility].  Will send to [facility] at patient's request. He has been encouraged to take his medication for his bipolar affective disorder.  To note, he relays that this back pain does not affect his sexual activity several times.  There is some intensity and histrionic qualities to his behavior today though this could be his normal affect. Treat migraine with abortive therapy.  May be that the sleep apnea is the culprit of his pain. He will rtc if his sxs do not improve. Acute midline low back pain with left-sided sciatica - Plan: DG Lumbar Spine Complete, DG Sacrum/Coccyx, meloxicam (MOBIC) 15 MG tablet, AMB referral to orthopedics, cyclobenzaprine (FLEXERIL) 10 MG tablet  Other migraine without status migrainosus, not intractable - Plan: CBC, Basic metabolic panel  Narrowing of lumbar intervertebral disc space - Plan: meloxicam (MOBIC) 15 MG tablet, AMB referral to orthopedics, cyclobenzaprine (FLEXERIL) 10 MG tablet  Ivar Drape, PA-C [facility] 3/16/20188:25 AM

## 2016-10-12 NOTE — Patient Instructions (Addendum)
I would like you to make sure that you are using ice three times per day for 15 minutes. I would like you to not take any naproxen or ibuprofen with the mobic. Do not use the flexeril with operating heavy machinery.    Migraine Headache A migraine headache is a very strong throbbing pain on one side or both sides of your head. Migraines can also cause other symptoms. Talk with your doctor about what things may bring on (trigger) your migraine headaches. Follow these instructions at home: Medicines   Take over-the-counter and prescription medicines only as told by your doctor.  Do not drive or use heavy machinery while taking prescription pain medicine.  To prevent or treat constipation while you are taking prescription pain medicine, your doctor may recommend that you:  Drink enough fluid to keep your pee (urine) clear or pale yellow.  Take over-the-counter or prescription medicines.  Eat foods that are high in fiber. These include fresh fruits and vegetables, whole grains, and beans.  Limit foods that are high in fat and processed sugars. These include fried and sweet foods. Lifestyle   Avoid alcohol.  Do not use any products that contain nicotine or tobacco, such as cigarettes and e-cigarettes. If you need help quitting, ask your doctor.  Get at least 8 hours of sleep every night.  Limit your stress. General instructions    Keep a journal to find out what may bring on your migraines. For example, write down:  What you eat and drink.  How much sleep you get.  Any change in what you eat or drink.  Any change in your medicines.  If you have a migraine:  Avoid things that make your symptoms worse, such as bright lights.  It may help to lie down in a dark, quiet room.  Do not drive or use heavy machinery.  Ask your doctor what activities are safe for you.  Keep all follow-up visits as told by your doctor. This is important. Contact a doctor if:  You get a  migraine that is different or worse than your usual migraines. Get help right away if:  Your migraine gets very bad.  You have a fever.  You have a stiff neck.  You have trouble seeing.  Your muscles feel weak or like you cannot control them.  You start to lose your balance a lot.  You start to have trouble walking.  You pass out (faint). This information is not intended to replace advice given to you by your health care provider. Make sure you discuss any questions you have with your health care provider. Document Released: 04/25/2008 Document Revised: 02/04/2016 Document Reviewed: 01/03/2016 Elsevier Interactive Patient Education  2017 [employer].    IF you received an x-ray today, you will receive an invoice from [facility]. Please contact [facility] at [phone] with questions or concerns regarding your invoice.   IF you received labwork today, you will receive an invoice from [facility]. Please contact [facility] at [phone] with questions or concerns regarding your invoice.   Our billing staff will not be able to assist you with questions regarding bills from these companies.  You will be contacted with the lab results as soon as they are available. The fastest way to get your results is to activate your My Chart account. Instructions are located on the last page of this paperwork. If you have not heard from [place] regarding the results in 2 weeks, please contact this office.

## 2016-10-13 LAB — BASIC METABOLIC PANEL
BUN/Creatinine Ratio: 6 — ABNORMAL LOW (ref 9–20)
BUN: 6 mg/dL (ref 6–24)
CO2: 25 mmol/L (ref 18–29)
Calcium: 9.8 mg/dL (ref 8.7–10.2)
Chloride: 101 mmol/L (ref 96–106)
Creatinine, Ser: 0.93 mg/dL (ref 0.76–1.27)
GFR, EST AFRICAN AMERICAN: 116 mL/min/{1.73_m2} (ref >59)
GFR, EST NON AFRICAN AMERICAN: 100 mL/min/{1.73_m2} (ref >59)
Glucose: 69 mg/dL (ref 65–99)
POTASSIUM: 4.6 mmol/L (ref 3.5–5.2)
SODIUM: 141 mmol/L (ref 134–144)

## 2016-10-13 LAB — CBC
HEMATOCRIT: 44.8 % (ref 37.5–51.0)
Hemoglobin: 15.1 g/dL (ref 13.0–17.7)
MCH: 30.8 pg (ref 26.6–33.0)
MCHC: 33.7 g/dL (ref 31.5–35.7)
MCV: 91 fL (ref 79–97)
Platelets: 426 10*3/uL — ABNORMAL HIGH (ref 150–379)
RBC: 4.9 x10E6/uL (ref 4.14–5.80)
RDW: 13.6 % (ref 12.3–15.4)
WBC: 5.3 10*3/uL (ref 3.4–10.8)

## 2016-12-07 ENCOUNTER — Ambulatory Visit: Payer: Medicare Other | Admitting: Family Medicine

## 2017-01-03 ENCOUNTER — Ambulatory Visit (HOSPITAL_COMMUNITY): Payer: Self-pay | Admitting: Psychiatry

## 2017-06-20 DIAGNOSIS — L309 Dermatitis, unspecified: Secondary | ICD-10-CM | POA: Diagnosis not present

## 2018-03-14 ENCOUNTER — Encounter: Payer: Self-pay | Admitting: *Deleted

## 2018-03-14 ENCOUNTER — Other Ambulatory Visit: Payer: Self-pay | Admitting: *Deleted

## 2018-03-14 DIAGNOSIS — F319 Bipolar disorder, unspecified: Secondary | ICD-10-CM

## 2018-03-14 NOTE — Patient Outreach (Addendum)
[facility]) Care Management  03/14/2018  Ryan [name] 03/07/1971 [id number]   Successful initial telephone outreach to review health risk assessment and screen for care management needs for [employer]. Ryan [name] spoke with this RNCM for over 45 minutes, his speech was inappropriately loud and pressured with flight of ideas.  Ryan [name] states his main health issues are sleep apnea with narcolepsy and bipolar disorder.  He says he does not have a primary care provider and he has not seen a psychiatrist in over a 1/12 years and therefore has been without his Seroquel and Zoloft for more than a year. ( His Epic electronic medical record reflects the last visit with a mental health professional was 09/28/16). He says he feels overwhelmed and unorganized and this has prevented him from establishing a primary care provider. He says he meditates and exercises to help with relaxation. 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 He said his ex- wife insisted he seek treatment for his sleep apnea and narcolepsy so he had two overnight sleep studies, one in 2007 and another in 2010. When he and his wife divorced in 2012, he did not take his CPAP equipment with him because he said he had difficulty consistently wearing it. He agrees that it is vital he establish with a primary care provider for an annual wellness exam, to address his health issues and for referral to a mental health professional.  He agrees he would benefit from a referral to a [facility] Licensed Clinical Social Worker (LCSW) to assist with his mental health  issues and to assist with community resources for transportation. Will also send secure e-mail to his [place] to assist  him with finding an in network primary care provider and psychiatrist.   Verified home address; will send successful outreach letter with [facility] [place] program brochure and 24 hr nurse line magnet.  Barrington Ellison RN,CCM,CDE [place] Management Coordinator Office Phone [phone] Office Fax [phone]

## 2018-03-15 NOTE — Addendum Note (Signed)
Addended by: Barrington Ellison on: 03/15/2018 12:53 PM   Modules accepted: Orders

## 2018-03-15 NOTE — Addendum Note (Signed)
Addended by: Barrington Ellison on: 03/15/2018 01:09 PM   Modules accepted: Orders

## 2018-03-20 ENCOUNTER — Ambulatory Visit: Payer: PPO | Admitting: Emergency Medicine

## 2018-03-21 ENCOUNTER — Encounter: Payer: Self-pay | Admitting: *Deleted

## 2018-03-21 ENCOUNTER — Other Ambulatory Visit: Payer: Self-pay | Admitting: *Deleted

## 2018-03-21 ENCOUNTER — Telehealth: Payer: Self-pay | Admitting: Medical

## 2018-03-21 NOTE — Patient Outreach (Signed)
[facility]) Care Management  03/21/2018  Ryan [name] Dec 25, 1970 [id number]   CSW made an initial attempt to try and contact patient today to perform the initial phone assessment, as well as assess and assist with social work needs and services, without success.  A HIPAA compliant message was left for patient on voicemail.  CSW is currently awaiting a return call.  CSW will make a second outreach attempt within the next 3-4 business days, if a return call is not received from patient in the meantime.  CSW will also mail an Outreach Letter to patient's home requesting that patient contact CSW if patient is interested in receiving social work services through [place] with Scientist, clinical (histocompatibility and immunogenetics). Nat Christen, BSW, MSW, LCSW  Licensed Education officer, environmental [facility]  Mailing [place] [address], [place], [place] [address] Physical Address-[address], [place], [place] [address] Toll Free Main # [phone] Fax # [phone] Cell # [phone]  Office # [phone] Ryan [name]

## 2018-03-21 NOTE — Telephone Encounter (Signed)
Pt has appointment with new provider. Somehow message was sent to me about upcoming appointment but I am no longer his provider. Mechele Claude Saporito tried to call pt. Her number is [phone]. He was dismissed from out practice. So will you call Mechele Claude and make sure he goes to correct office as he is dismissed from our office and should not be scheduled with me.

## 2018-03-25 ENCOUNTER — Ambulatory Visit (INDEPENDENT_AMBULATORY_CARE_PROVIDER_SITE_OTHER): Payer: PPO | Admitting: Emergency Medicine

## 2018-03-25 ENCOUNTER — Other Ambulatory Visit: Payer: Self-pay

## 2018-03-25 ENCOUNTER — Encounter: Payer: Self-pay | Admitting: Emergency Medicine

## 2018-03-25 VITALS — BP 121/74 | HR 62 | Temp 98.3°F | Resp 16 | Ht 68.75 in | Wt 189.0 lb

## 2018-03-25 DIAGNOSIS — G4730 Sleep apnea, unspecified: Secondary | ICD-10-CM

## 2018-03-25 DIAGNOSIS — Z13228 Encounter for screening for other metabolic disorders: Secondary | ICD-10-CM | POA: Diagnosis not present

## 2018-03-25 DIAGNOSIS — Z130 Encounter for screening for diseases of the blood and blood-forming organs and certain disorders involving the immune mechanism: Secondary | ICD-10-CM

## 2018-03-25 DIAGNOSIS — Z1329 Encounter for screening for other suspected endocrine disorder: Secondary | ICD-10-CM

## 2018-03-25 DIAGNOSIS — R51 Headache: Secondary | ICD-10-CM

## 2018-03-25 DIAGNOSIS — Z1321 Encounter for screening for nutritional disorder: Secondary | ICD-10-CM

## 2018-03-25 DIAGNOSIS — G8929 Other chronic pain: Secondary | ICD-10-CM

## 2018-03-25 DIAGNOSIS — M545 Low back pain: Secondary | ICD-10-CM | POA: Diagnosis not present

## 2018-03-25 DIAGNOSIS — G4700 Insomnia, unspecified: Secondary | ICD-10-CM

## 2018-03-25 DIAGNOSIS — M5136 Other intervertebral disc degeneration, lumbar region: Secondary | ICD-10-CM | POA: Diagnosis not present

## 2018-03-25 DIAGNOSIS — Z8659 Personal history of other mental and behavioral disorders: Secondary | ICD-10-CM

## 2018-03-25 DIAGNOSIS — Z114 Encounter for screening for human immunodeficiency virus [HIV]: Secondary | ICD-10-CM | POA: Diagnosis not present

## 2018-03-25 DIAGNOSIS — Z0001 Encounter for general adult medical examination with abnormal findings: Secondary | ICD-10-CM

## 2018-03-25 DIAGNOSIS — Z23 Encounter for immunization: Secondary | ICD-10-CM

## 2018-03-25 MED ORDER — MELOXICAM 15 MG PO TABS: 15.0000 mg | ORAL_TABLET | Freq: Every day | ORAL | 0 refills | Status: AC

## 2018-03-25 MED ORDER — CYCLOBENZAPRINE HCL 10 MG PO TABS: 5.0000 mg | ORAL_TABLET | Freq: Two times a day (BID) | ORAL | 0 refills | Status: AC | PRN

## 2018-03-25 NOTE — Progress Notes (Signed)
Ryan [name] 47 y.o.   Chief Complaint  Patient presents with  . Annual Exam    ESTABLISH CARE  . Medication Refill    FLEXERIL, MOBIC and IMITREX    HISTORY OF PRESENT ILLNESS: This is a 47 y.o. male here for annual exam.  First time seen by me.  He has the following chronic medical problems: 1.  History of sleep apnea, currently no CPAP machine. 2.  Chronic "migraine" headaches. 3.  Bipolar disorder, presently on no medications and no psychiatric care. 4.  Chronic low back pain, takes occasional Mobic and Flexeril  HPI   Prior to Admission medications   Medication Sig Start Date End Date Taking? Authorizing Provider  cyclobenzaprine (FLEXERIL) 10 MG tablet Take 0.5-1 tablets (5-10 mg total) by mouth 3 (three) times daily as needed for muscle spasms. 10/12/16  Yes English, Colletta [place] D, PA  meloxicam (MOBIC) 15 MG tablet Take 1 tablet (15 mg total) by mouth daily. 10/12/16  Yes English, Colletta Maryland D, PA  QUEtiapine (SEROQUEL) 50 MG tablet Take 1 tablet (50 mg total) by mouth at bedtime. 10/03/16 10/03/17  Arfeen, Arlyce Harman, MD  sertraline (ZOLOFT) 100 MG tablet Take 1 tablet (100 mg total) by mouth at bedtime. Patient not taking: Reported on 03/25/2018 10/03/16   Arfeen, Arlyce Harman, MD  SUMAtriptan (IMITREX) 50 MG tablet Take 0.5-1 tablets (25-50 mg total) by mouth every 2 (two) hours as needed for migraine. May repeat after 2 hours.  Do not exceed 200 mg in 24 hours. Patient not taking: Reported on 03/25/2018 10/12/16   Ivar Drape D, PA    No Known Allergies  Patient Active Problem List   Diagnosis Date Noted  . Sleep apnea 01/13/2015  . Anemia 01/13/2015  . History of bipolar disorder 01/13/2015  . Insomnia w/ sleep apnea 01/13/2015    Past Medical History:  Diagnosis Date  . Allergy   . Anemia   . Bipolar disorder ([place])   . Blood transfusion without reported diagnosis   . Chronic back pain    Lower back injury with coccyx dysfunction; disability.  . Sleep apnea     No  past surgical history on file.  Social History   Socioeconomic History  . Marital status: Single    Spouse name: Not on file  . Number of children: Not on file  . Years of education: Not on file  . Highest education level: Not on file  Occupational History  . Not on file  Social Needs  . Financial resource strain: Not on file  . Food insecurity:    Worry: Not on file    Inability: Not on file  . Transportation needs:    Medical: Not on file    Non-medical: Not on file  Tobacco Use  . Smoking status: Former Research scientist (life sciences)  . Smokeless tobacco: Never Used  Substance and Sexual Activity  . Alcohol use: No    Alcohol/week: 0.0 standard drinks  . Drug use: No  . Sexual activity: Yes    Birth control/protection: Condom  Lifestyle  . Physical activity:    Days per week: Not on file    Minutes per session: Not on file  . Stress: Not on file  Relationships  . Social connections:    Talks on phone: Not on file    Gets together: Not on file    Attends religious service: Not on file    Active member of club or organization: Not on file    Attends meetings  of clubs or organizations: Not on file    Relationship status: Not on file  . Intimate partner violence:    Fear of current or ex partner: Not on file    Emotionally abused: Not on file    Physically abused: Not on file    Forced sexual activity: Not on file  Other Topics Concern  . Not on file  Social History Narrative   Marital : single; from [place]; moved to [place] in 1999.      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          No family history on file.   Review of Systems  Constitutional: Negative.   HENT: Negative.  Negative for congestion, hearing loss, nosebleeds, sinus pain and sore throat.   Eyes: Negative.  Negative for blurred vision and double vision.  Respiratory: Negative.  Negative for cough and shortness of breath.     Cardiovascular: Negative.  Negative for chest pain, palpitations, claudication and leg swelling.  Gastrointestinal: Negative.  Negative for abdominal pain, diarrhea, nausea and vomiting.  Genitourinary: Negative.  Negative for dysuria and hematuria.  Musculoskeletal: Positive for back pain and neck pain.  Skin: Negative.  Negative for rash.  Neurological: Positive for headaches.  Endo/Heme/Allergies: Negative.   Psychiatric/Behavioral: The patient has insomnia.   All other systems reviewed and are negative.   Vitals:   03/25/18 1446  BP: 121/74  Pulse: 62  Resp: 16  Temp: 98.3 F (36.8 C)  SpO2: 98%    Physical Exam  Constitutional: He is oriented to person, place, and time. He appears well-developed and well-nourished.  HENT:  Head: Normocephalic and atraumatic.  Nose: Nose normal.  Mouth/Throat: Oropharynx is clear and moist.  Eyes: Pupils are equal, round, and reactive to light. Conjunctivae and EOM are normal.  Neck: Normal range of motion. Neck supple. No JVD present. No thyromegaly present.  Cardiovascular: Normal rate, regular rhythm and normal heart sounds.  Pulmonary/Chest: Effort normal and breath sounds normal.  Abdominal: Soft. He exhibits no distension. There is no tenderness.  Musculoskeletal: Normal range of motion. He exhibits no edema or tenderness.  Lymphadenopathy:    He has no cervical adenopathy.  Neurological: He is alert and oriented to person, place, and time. No sensory deficit. He exhibits normal muscle tone.  Skin: Skin is warm and dry. Capillary refill takes less than 2 seconds.  Psychiatric: He has a normal mood and affect. His behavior is normal.  Vitals reviewed.    ASSESSMENT & PLAN: Wenceslao was seen today for annual exam and medication refill.  Diagnoses and all orders for this visit:  History of bipolar disorder -     Ambulatory referral to Psychology  Insomnia w/ sleep apnea -     CBC with Differential -     Ambulatory referral to  Neurology  Chronic bilateral low back pain without sciatica -     CBC with Differential -     meloxicam (MOBIC) 15 MG tablet; Take 1 tablet (15 mg total) by mouth daily. Take only as needed. -     cyclobenzaprine (FLEXERIL) 10 MG tablet; Take 0.5-1 tablets (5-10 mg total) by mouth 2 (two) times daily as needed for muscle spasms.  Screening for endocrine, nutritional, metabolic and immunity disorder -     CBC with Differential -     Comprehensive metabolic panel -     HIV antibody  Chronic  nonintractable headache, unspecified headache type -     CBC with Differential -     Ambulatory referral to Neurology  Screening for HIV (human immunodeficiency virus) -     HIV antibody  Encounter for general adult medical examination with abnormal findings -     Tdap vaccine greater than or equal to 7yo IM -     CBC with Differential -     HIV antibody  Narrowing of lumbar intervertebral disc space    Patient Instructions       If you have lab work done today you will be contacted with your lab results within the next 2 weeks.  If you have not heard from [place] then please contact us. The fastest way to get your results is to register for My Chart.   IF you received an x-ray today, you will receive an invoice from [facility]. Please contact [facility] at [phone] with questions or concerns regarding your invoice.   IF you received labwork today, you will receive an invoice from [facility]. Please contact [facility] at [phone] with questions or concerns regarding your invoice.   Our billing staff will not be able to assist you with questions regarding bills from these companies.  You will be contacted with the lab results as soon as they are available. The fastest way to get your results is to activate your My Chart account. Instructions are located on the last page of this paperwork. If you have not heard from [place] regarding the results in 2 weeks, please contact this  office.      Health Maintenance, Male A healthy lifestyle and preventive care is important for your health and wellness. Ask your health care provider about what schedule of regular examinations is right for you. What should I know about weight and diet? Eat a Healthy Diet  Eat plenty of vegetables, fruits, whole grains, low-fat dairy products, and lean protein.  Do not eat a lot of foods high in solid fats, added sugars, or salt.  Maintain a Healthy Weight Regular exercise can help you achieve or maintain a healthy weight. You should:  Do at least 150 minutes of exercise each week. The exercise should increase your heart rate and make you sweat (moderate-intensity exercise).  Do strength-training exercises at least twice a week.  Watch Your Levels of Cholesterol and Blood Lipids  Have your blood tested for lipids and cholesterol every 5 years starting at 45 years of age. If you are at high risk for heart disease, you should start having your blood tested when you are 45 years old. You may need to have your cholesterol levels checked more often if: ? Your lipid or cholesterol levels are high. ? You are older than 45 years of age. ? You are at high risk for heart disease.  What should I know about cancer screening? Many types of cancers can be detected early and may often be prevented. Lung Cancer  You should be screened every year for lung cancer if: ? You are a current smoker who has smoked for at least 30 years. ? You are a former smoker who has quit within the past 15 years.  Talk to your health care provider about your screening options, when you should start screening, and how often you should be screened.  Colorectal Cancer  Routine colorectal cancer screening usually begins at 45 years of age and should be repeated every 5-10 years until you are 45 years old. You may need  to be screened more often if early forms of precancerous polyps or small growths are found. Your  health care provider may recommend screening at an earlier age if you have risk factors for colon cancer.  Your health care provider may recommend using home test kits to check for hidden blood in the stool.  A small camera at the end of a tube can be used to examine your colon (sigmoidoscopy or colonoscopy). This checks for the earliest forms of colorectal cancer.  Prostate and Testicular Cancer  Depending on your age and overall health, your health care provider may do certain tests to screen for prostate and testicular cancer.  Talk to your health care provider about any symptoms or concerns you have about testicular or prostate cancer.  Skin Cancer  Check your skin from head to toe regularly.  Tell your health care provider about any new moles or changes in moles, especially if: ? There is a change in a mole's size, shape, or color. ? You have a mole that is larger than a pencil eraser.  Always use sunscreen. Apply sunscreen liberally and repeat throughout the day.  Protect yourself by wearing long sleeves, pants, a wide-brimmed hat, and sunglasses when outside.  What should I know about heart disease, diabetes, and high blood pressure?  If you are 31-32 years of age, have your blood pressure checked every 3-5 years. If you are 75 years of age or older, have your blood pressure checked every year. You should have your blood pressure measured twice-once when you are at a hospital or clinic, and once when you are not at a hospital or clinic. Record the average of the two measurements. To check your blood pressure when you are not at a hospital or clinic, you can use: ? An automated blood pressure machine at a pharmacy. ? A home blood pressure monitor.  Talk to your health care provider about your target blood pressure.  If you are between 66-67 years old, ask your health care provider if you should take aspirin to prevent heart disease.  Have regular diabetes screenings by  checking your fasting blood sugar level. ? If you are at a normal weight and have a low risk for diabetes, have this test once every three years after the age of 70. ? If you are overweight and have a high risk for diabetes, consider being tested at a younger age or more often.  A one-time screening for abdominal aortic aneurysm (AAA) by ultrasound is recommended for men aged 69-75 years who are current or former smokers. What should I know about preventing infection? Hepatitis B If you have a higher risk for hepatitis B, you should be screened for this virus. Talk with your health care provider to find out if you are at risk for hepatitis B infection. Hepatitis C Blood testing is recommended for:  Everyone born from [birth year] through 1963.  Anyone with known risk factors for hepatitis C.  Sexually Transmitted Diseases (STDs)  You should be screened each year for STDs including gonorrhea and chlamydia if: ? You are sexually active and are younger than 45 years of age. ? You are older than 45 years of age and your health care provider tells you that you are at risk for this type of infection. ? Your sexual activity has changed since you were last screened and you are at an increased risk for chlamydia or gonorrhea. Ask your health care provider if you are at risk.  Talk with  your health care provider about whether you are at high risk of being infected with HIV. Your health care provider may recommend a prescription medicine to help prevent HIV infection.  What else can I do?  Schedule regular health, dental, and eye exams.  Stay current with your vaccines (immunizations).  Do not use any tobacco products, such as cigarettes, chewing tobacco, and e-cigarettes. If you need help quitting, ask your health care provider.  Limit alcohol intake to no more than 2 drinks per day. One drink equals 12 ounces of beer, 5 ounces of wine, or 1 ounces of hard liquor.  Do not use street drugs.  Do not  share needles.  Ask your health care provider for help if you need support or information about quitting drugs.  Tell your health care provider if you often feel depressed.  Tell your health care provider if you have ever been abused or do not feel safe at home. This information is not intended to replace advice given to you by your health care provider. Make sure you discuss any questions you have with your health care provider. Document Released: 01/13/2008 Document Revised: 03/15/2016 Document Reviewed: 04/20/2015 Elsevier Interactive Patient Education  2018 Elsevier Inc.      Agustina Caroli, MD [facility]

## 2018-03-25 NOTE — Patient Instructions (Addendum)

## 2018-03-26 ENCOUNTER — Encounter: Payer: Self-pay | Admitting: Radiology

## 2018-03-26 LAB — COMPREHENSIVE METABOLIC PANEL
ALK PHOS: 80 IU/L (ref 39–117)
ALT: 12 IU/L (ref 0–44)
AST: 17 IU/L (ref 0–40)
Albumin/Globulin Ratio: 2 (ref 1.2–2.2)
Albumin: 4.3 g/dL (ref 3.5–5.5)
BUN/Creatinine Ratio: 16 (ref 9–20)
BUN: 15 mg/dL (ref 6–24)
Bilirubin Total: 0.5 mg/dL (ref 0.0–1.2)
CO2: 22 mmol/L (ref 20–29)
CREATININE: 0.93 mg/dL (ref 0.76–1.27)
Calcium: 9 mg/dL (ref 8.7–10.2)
Chloride: 104 mmol/L (ref 96–106)
GFR calc Af Amer: 115 mL/min/{1.73_m2} (ref >59)
GFR, EST NON AFRICAN AMERICAN: 99 mL/min/{1.73_m2} (ref >59)
GLOBULIN, TOTAL: 2.2 g/dL (ref 1.5–4.5)
GLUCOSE: 81 mg/dL (ref 65–99)
Potassium: 4.4 mmol/L (ref 3.5–5.2)
SODIUM: 136 mmol/L (ref 134–144)
Total Protein: 6.5 g/dL (ref 6.0–8.5)

## 2018-03-26 LAB — CBC WITH DIFFERENTIAL/PLATELET
BASOS ABS: 0 10*3/uL (ref 0.0–0.2)
Basos: 0 %
EOS (ABSOLUTE): 0 10*3/uL (ref 0.0–0.4)
Eos: 0 %
HEMATOCRIT: 42.7 % (ref 37.5–51.0)
Hemoglobin: 13.9 g/dL (ref 13.0–17.7)
IMMATURE GRANULOCYTES: 0 %
Immature Grans (Abs): 0 10*3/uL (ref 0.0–0.1)
LYMPHS ABS: 2.7 10*3/uL (ref 0.7–3.1)
Lymphs: 54 %
MCH: 29.8 pg (ref 26.6–33.0)
MCHC: 32.6 g/dL (ref 31.5–35.7)
MCV: 92 fL (ref 79–97)
Monocytes Absolute: 0.6 10*3/uL (ref 0.1–0.9)
Monocytes: 11 %
NEUTROS PCT: 35 %
Neutrophils Absolute: 1.7 10*3/uL (ref 1.4–7.0)
PLATELETS: 385 10*3/uL (ref 150–450)
RBC: 4.66 x10E6/uL (ref 4.14–5.80)
RDW: 14.2 % (ref 12.3–15.4)
WBC: 5 10*3/uL (ref 3.4–10.8)

## 2018-03-26 LAB — HIV ANTIBODY (ROUTINE TESTING W REFLEX): HIV SCREEN 4TH GENERATION: NONREACTIVE

## 2018-03-27 ENCOUNTER — Other Ambulatory Visit: Payer: Self-pay | Admitting: *Deleted

## 2018-03-27 NOTE — Patient Outreach (Signed)
[facility]) Care Management  03/27/2018  Ryan [name] Sep 30, 1970 [id number]   CSW made a second attempt to try and contact patient today to perform phone assessment, as well as assess and assist with social work needs and services, without success.  A HIPAA compliant message was left for patient on voicemail.  CSW continues to await a return call.  CSW will make a third and final outreach attempt within the next 3-4 business days, if a return call is not received from patient in the meantime.  CSW will then proceed with case closure if a return call is not received from patient with a total of 10 business days, as required number of phone attempts will have been made and outreach letter mailed.  Nat Christen, BSW, MSW, LCSW  Licensed Education officer, environmental Health System  Mailing [place] [address], [place], [place] [address] Physical Address-[address], [place], [place] [address] Toll Free Main # [phone] Fax # [phone] Cell # [phone]  Office # [phone] [name]

## 2018-04-02 ENCOUNTER — Telehealth: Payer: Self-pay

## 2018-04-02 ENCOUNTER — Institutional Professional Consult (permissible substitution): Payer: PPO | Admitting: Neurology

## 2018-04-02 ENCOUNTER — Other Ambulatory Visit: Payer: Self-pay | Admitting: *Deleted

## 2018-04-02 NOTE — Telephone Encounter (Signed)
Pt did not show for their appt with Dr. Athar today.  

## 2018-04-02 NOTE — Patient Outreach (Signed)
[facility]) Care Management  04/02/2018  Ryan [name] [birth year] [id number]    CSW made a third and final attempt to try and contact patient today to perform phone assessment, as well as assess and assist with social work needs and services, without success.  A HIPAA compliant message was left for patient on voicemail.  CSW is currently awaiting a return call.  CSW will proceed with case closure in two business days, if a return call is not received in the meantime, as required number of phone attempts have been made and an outreach letter was mailed to patient's home allowing 10 business days for a response. Nat Christen, BSW, MSW, LCSW  Licensed Education officer, environmental Health System  Mailing [place] [address], [place], [place] [address] Physical Address[address], [place], [place] [address] Toll Free Main # [phone] Fax # [phone] Cell # [phone]  Office # [phone] [email or web address]

## 2018-04-03 ENCOUNTER — Telehealth: Payer: Self-pay

## 2018-04-03 ENCOUNTER — Institutional Professional Consult (permissible substitution): Payer: PPO | Admitting: Neurology

## 2018-04-03 ENCOUNTER — Encounter: Payer: Self-pay | Admitting: Neurology

## 2018-04-03 NOTE — Telephone Encounter (Signed)
No action needed

## 2018-04-03 NOTE — Telephone Encounter (Signed)
Pt did not show for their appt with Dr. Rexene Alberts today.  This is pt's second no show as a new patient.

## 2018-04-03 NOTE — Telephone Encounter (Signed)
Patient called and wanted to speak with manager regarding his no show fee. I explained that this was our policy and we cannot deviate from it. He wanted [place] to waive the fee but I explained we could not do this or we would have to do it for every patient. I told him I was sorry but we had to stick by the policy. He said ok and he understood.

## 2018-04-03 NOTE — Telephone Encounter (Signed)
Please follow protocol as per our No Show Policy.

## 2018-04-04 ENCOUNTER — Encounter: Payer: Self-pay | Admitting: *Deleted

## 2018-04-04 ENCOUNTER — Telehealth: Payer: Self-pay

## 2018-04-04 ENCOUNTER — Other Ambulatory Visit: Payer: Self-pay | Admitting: *Deleted

## 2018-04-04 NOTE — Telephone Encounter (Addendum)
We are aware of the 2 no shows of the new patient appts and this has been sent to [facility] for review.

## 2018-04-04 NOTE — Telephone Encounter (Signed)
Pt would like update on referral for sleep study.  States he was initially referred to [facility] but called back and asked to be send somewhere else.  Please c/b at home number.

## 2018-04-04 NOTE — Patient Outreach (Signed)
[name]) Care Management  04/04/2018  Ryan [name] [birth year] [id number]   CSW will perform a case closure on patient, due to inability to establish initial phone contact, despite required number of phone attempts made and outreach letter mailed to patient's home allowing 10 business days for a response.  CSW will fax an update to patient's Primary Care Physician to ensure that they are aware of CSW's involvement with patient's plan of care. Ryan [name], BSW, MSW, LCSW  Licensed Education officer, environmental Health System  Mailing [place] [address], [place], [place] [address] Physical Address[address], [place], [place] [address] Toll Free Main # [phone] Fax # [phone] Cell # [phone]  Office # [phone] [email or web address]

## 2018-04-04 NOTE — Telephone Encounter (Signed)
FYI- Patient has had 2 (New Patient) no shows at [facility] in 2019.

## 2018-04-08 ENCOUNTER — Encounter: Payer: Self-pay | Admitting: Neurology

## 2018-04-15 ENCOUNTER — Telehealth: Payer: Self-pay | Admitting: Emergency Medicine

## 2018-04-15 NOTE — Telephone Encounter (Signed)
Attempted to call pt back in regards to medical records request.  If pt calls back, I just need to know what records he wants and where he wants it sent to.  Thanks

## 2018-10-03 IMAGING — DX DG SACRUM/COCCYX 2+V
3 series · 3 of 3 positions shown · non-contrast
Comparison: Lumbar radiographs same day

CLINICAL DATA: Acute lumbosacral back pain.

EXAM:
SACRUM AND COCCYX - 2+ VIEW

[coccyx ap]
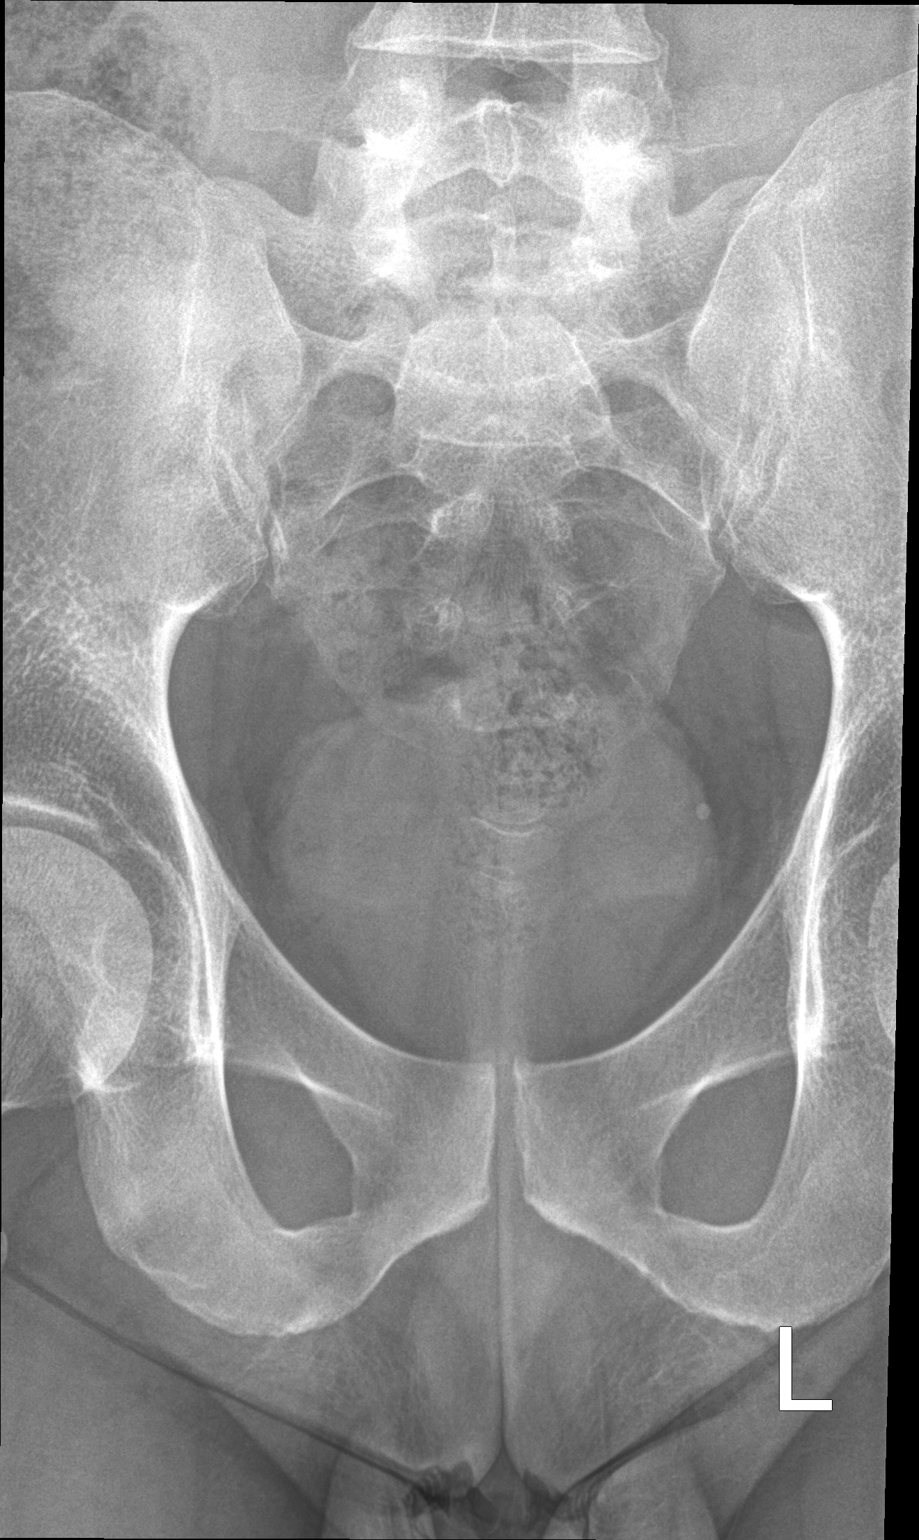

[sacrum ap]
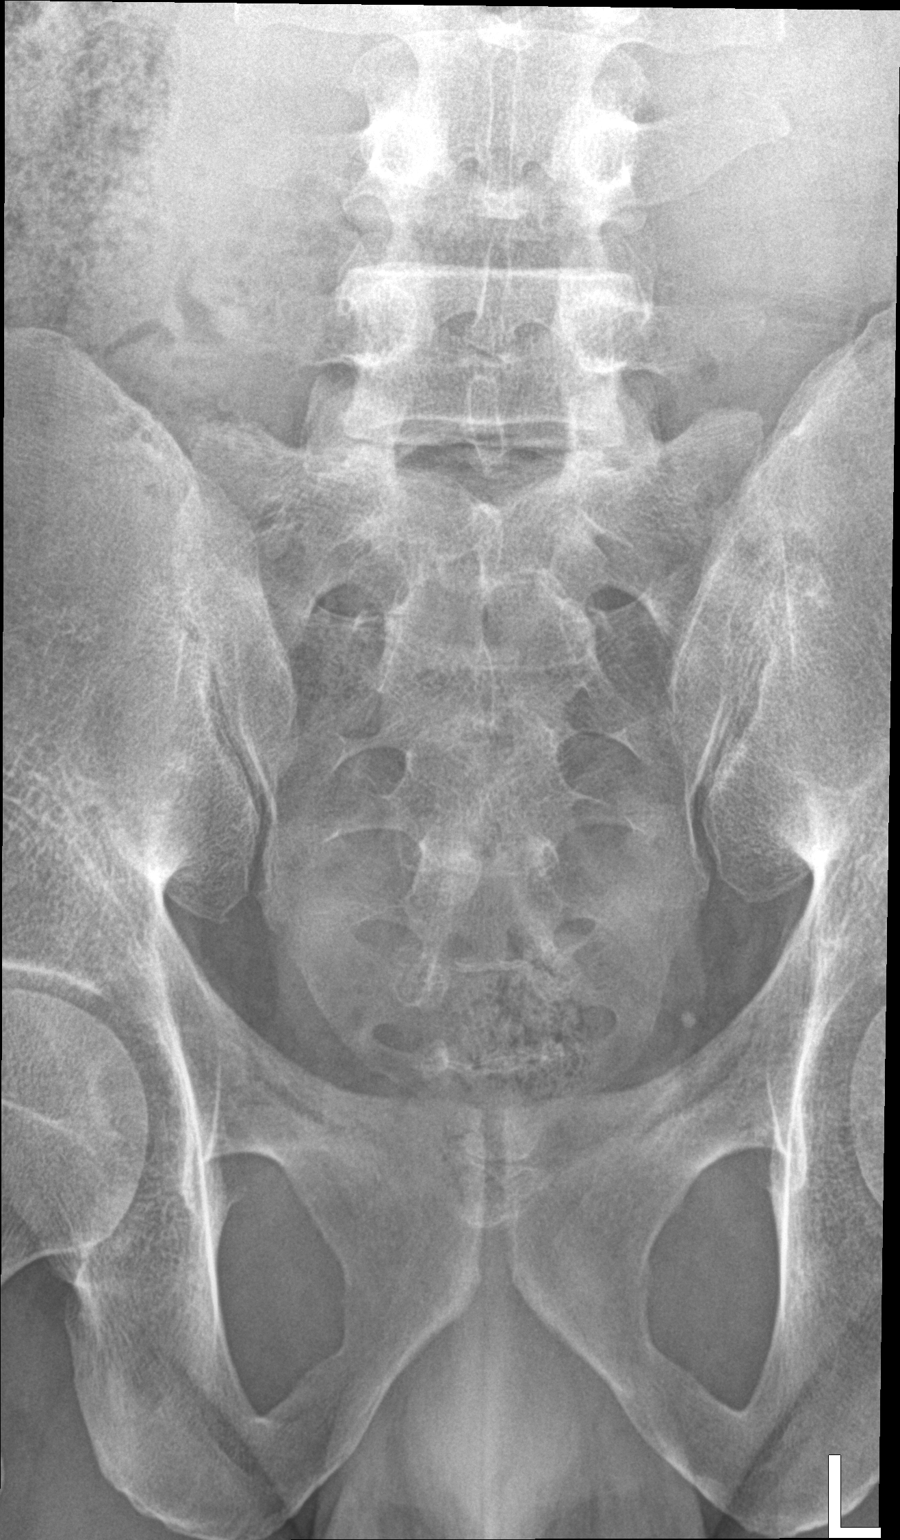

[sacrum lat]
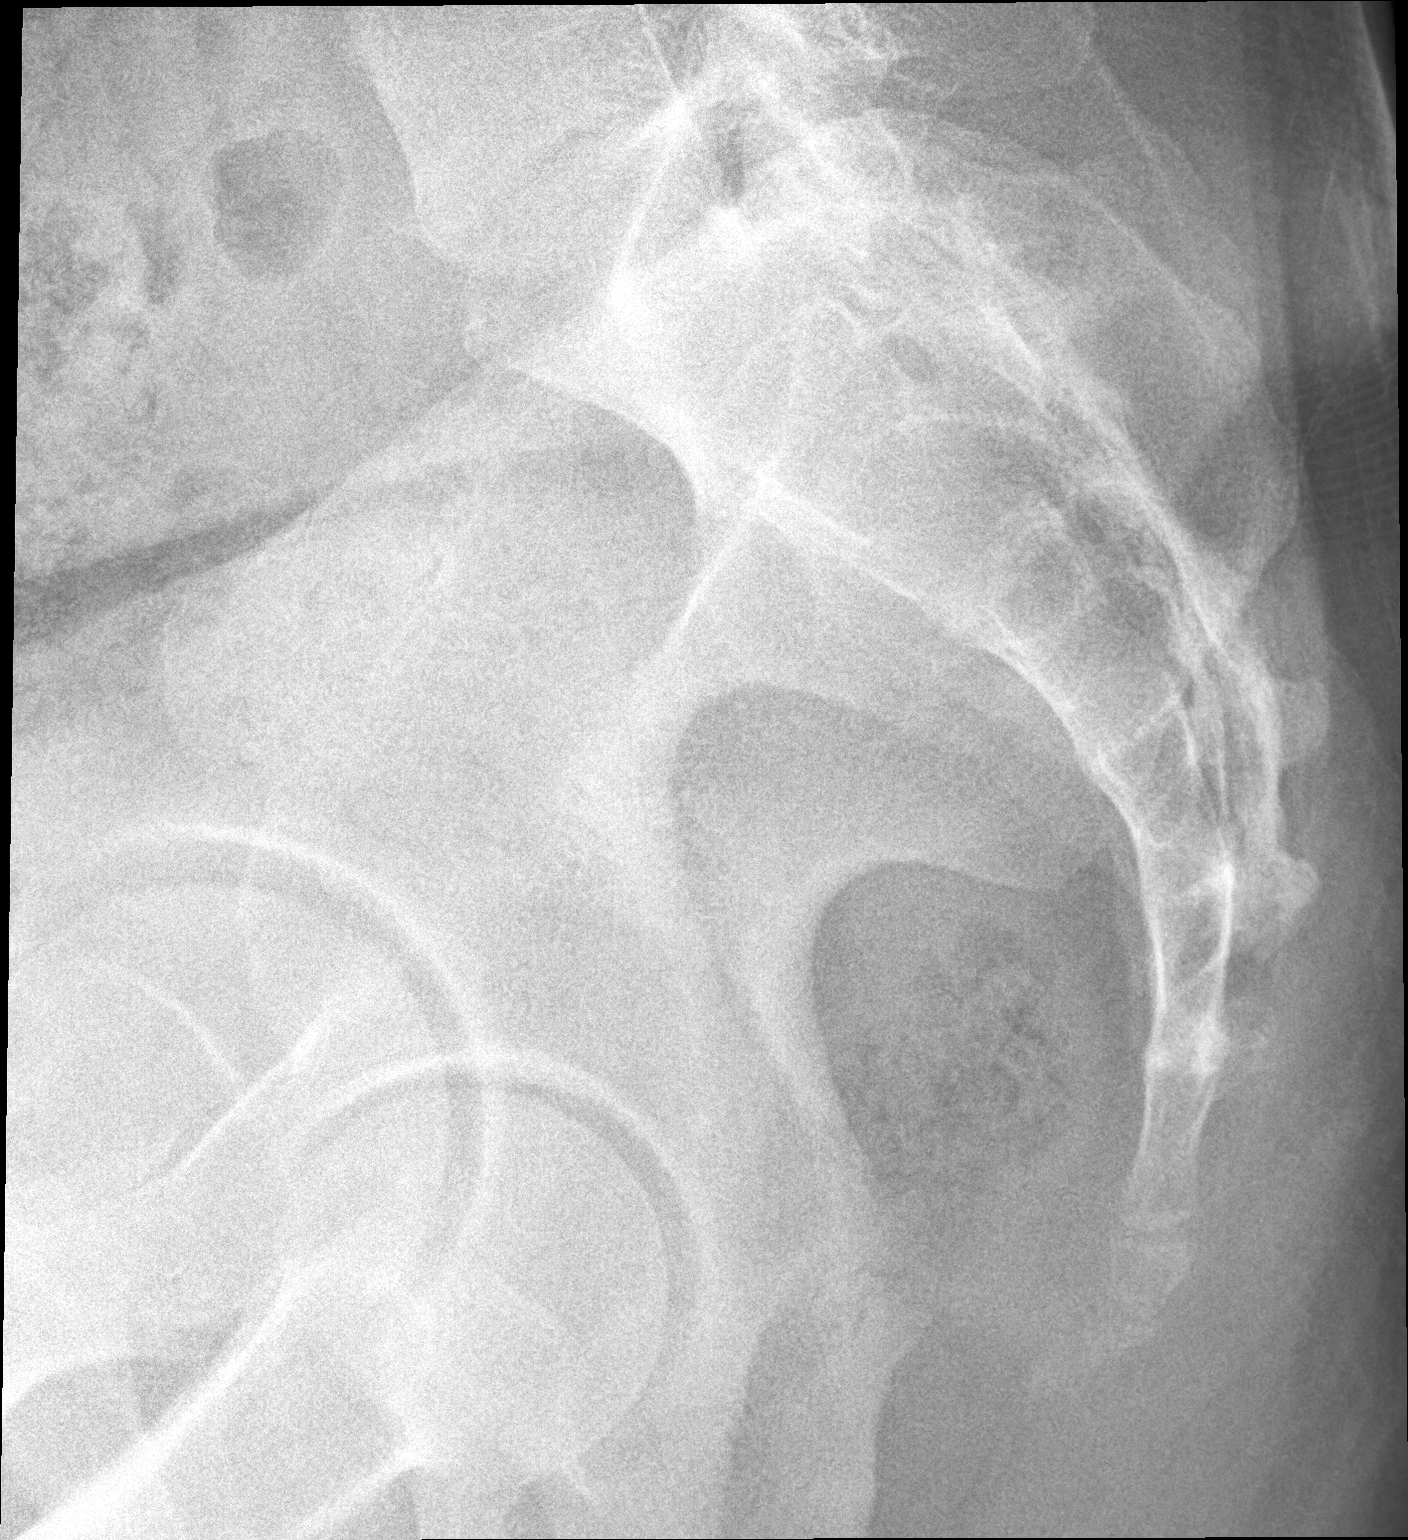

[3 of 3 positions shown; findings below may reference images not displayed]

FINDINGS: Sacrum and coccyx appear normal. Sacroiliac joints appear normal.
Visualized portions of the pelvic girdle appear normal. Symphysis
pubis is normal..
IMPRESSION: Negative.

## 2019-05-07 DIAGNOSIS — G47419 Narcolepsy without cataplexy: Secondary | ICD-10-CM | POA: Diagnosis not present

## 2019-10-12 ENCOUNTER — Emergency Department (HOSPITAL_BASED_OUTPATIENT_CLINIC_OR_DEPARTMENT_OTHER)
Admission: EM | Admit: 2019-10-12 | Discharge: 2019-10-13 | Disposition: A | Discharge status: Home / Self Care | Payer: Medicare Other | Attending: Emergency Medicine | Admitting: Emergency Medicine

## 2019-10-12 ENCOUNTER — Encounter (HOSPITAL_BASED_OUTPATIENT_CLINIC_OR_DEPARTMENT_OTHER): Payer: Self-pay | Admitting: *Deleted

## 2019-10-12 ENCOUNTER — Other Ambulatory Visit: Payer: Self-pay

## 2019-10-12 DIAGNOSIS — Z113 Encounter for screening for infections with a predominantly sexual mode of transmission: Secondary | ICD-10-CM | POA: Insufficient documentation

## 2019-10-12 DIAGNOSIS — R369 Urethral discharge, unspecified: Secondary | ICD-10-CM | POA: Insufficient documentation

## 2019-10-12 DIAGNOSIS — Z87891 Personal history of nicotine dependence: Secondary | ICD-10-CM | POA: Insufficient documentation

## 2019-10-12 DIAGNOSIS — Z711 Person with feared health complaint in whom no diagnosis is made: Secondary | ICD-10-CM

## 2019-10-12 LAB — URINALYSIS, MICROSCOPIC (REFLEX)
RBC / HPF: NONE SEEN RBC/hpf (ref 0–5)
Squamous Epithelial / HPF: NONE SEEN (ref 0–5)
WBC, UA: >50 WBC/hpf (ref 0–5)

## 2019-10-12 LAB — URINALYSIS, ROUTINE W REFLEX MICROSCOPIC
Bilirubin Urine: NEGATIVE
Glucose, UA: NEGATIVE mg/dL
Hgb urine dipstick: NEGATIVE
Ketones, ur: NEGATIVE mg/dL
Nitrite: NEGATIVE
Protein, ur: NEGATIVE mg/dL
Specific Gravity, Urine: 1.02 (ref 1.005–1.030)
pH: 8 (ref 5.0–8.0)

## 2019-10-12 MED ORDER — LIDOCAINE HCL (PF) 1 % IJ SOLN: 1.0000 mL | Freq: Once | INTRAMUSCULAR | Status: AC

## 2019-10-12 MED ORDER — LIDOCAINE HCL (PF) 1 % IJ SOLN
INTRAMUSCULAR | Status: AC
  Administered 2019-10-12: 1 mL
  Filled 2019-10-12: qty 5

## 2019-10-12 MED ORDER — CEFTRIAXONE SODIUM 500 MG IJ SOLR
500.0000 mg | Freq: Once | INTRAMUSCULAR | Status: AC
  Administered 2019-10-12: 500 mg via INTRAMUSCULAR
  Filled 2019-10-12: qty 500

## 2019-10-12 MED ORDER — DOXYCYCLINE HYCLATE 100 MG PO CAPS: 100.0000 mg | ORAL_CAPSULE | Freq: Two times a day (BID) | ORAL | 0 refills | Status: AC

## 2019-10-12 MED ORDER — DOXYCYCLINE HYCLATE 100 MG PO TABS
100.0000 mg | ORAL_TABLET | Freq: Once | ORAL | Status: AC
  Administered 2019-10-12: 100 mg via ORAL
  Filled 2019-10-12: qty 1

## 2019-10-12 NOTE — Discharge Instructions (Addendum)
Thank you for allowing me to provide your care today in the emergency department.  Your gonorrhea, chlamydia testing is pending.  You were treated for gonorrhea today.  -The treatment for chlamydia is doxycycline.  You were given 1 pill today and a prescription was sent to your pharmacy for the rest. It is very important that you take this medicine as prescribed.  Your syphilis and HIV test are also pending.  If any of these tests are positive, someone from the hospital will call you at the number that we discussed.  You can also download the my chart application and use the number on your discharge paperwork to register for an account.  Lab results are typically available in the app 72 hours after they have resulted.  The [facility] for disease control recommends abstaining from all sexual activities for 7 days after being treated.  If any of your tests are positive, it is important that you let all of your sexual partners know so they can seek treatment.  It is also important to note that if you were treated and have sex with someone that has not been treated that you can be reinfected.  Return to the emergency department if you develop significantly worsening symptoms such as fever, chills, severe pain, redness, or swelling to the penis or testicles, or if the discharge from your penis does not improve.

## 2019-10-12 NOTE — ED Provider Notes (Signed)
[facility] EMERGENCY DEPARTMENT Provider Note   CSN: [id number] Arrival date & time: 10/12/19  2057     History Chief Complaint  Patient presents with  . Penile Discharge    Ryan [name] is a 49 y.o. male with past medical history significant for bipolar disorder, anemia presents emergency department today with chief complaint of penile discharge x2 days.  He describes the discharge as white and milky.  He endorses dysuria, describing as burning sensation when he pees.  He states he is sexually active with 1 male partner without protection.  He denies fever, chills, abdominal pain, nausea, vomiting, urinary frequency, gross hematuria, penile sores, penile lesions. He admits to history of STDs, with most recent being 07/29/2019.  He is reporting similar symptoms today.    Past Medical History:  Diagnosis Date  . Allergy   . Anemia   . Bipolar disorder ([place])   . Blood transfusion without reported diagnosis   . Chronic back pain    Lower back injury with coccyx dysfunction; disability.  . Sleep apnea     Patient Active Problem List   Diagnosis Date Noted  . Sleep apnea 01/13/2015  . Anemia 01/13/2015  . History of bipolar disorder 01/13/2015  . Insomnia w/ sleep apnea 01/13/2015    History reviewed. No pertinent surgical history.     No family history on file.  Social History   Tobacco Use  . Smoking status: Former Research scientist (life sciences)  . Smokeless tobacco: Never Used  Substance Use Topics  . Alcohol use: No    Alcohol/week: 0.0 standard drinks  . Drug use: No    Home Medications Prior to Admission medications   Medication Sig Start Date End Date Taking? Authorizing Provider  cyclobenzaprine (FLEXERIL) 10 MG tablet Take 0.5-1 tablets (5-10 mg total) by mouth 2 (two) times daily as needed for muscle spasms. 03/25/18   Horald Pollen, MD  doxycycline (VIBRAMYCIN) 100 MG capsule Take 1 capsule (100 mg total) by mouth 2 (two) times daily for 7 days. 10/12/19  10/19/19  Caroline Matters, Harley Hallmark, PA-C  meloxicam (MOBIC) 15 MG tablet Take 1 tablet (15 mg total) by mouth daily. Take only as needed. 03/25/18   Horald Pollen, MD  SUMAtriptan (IMITREX) 50 MG tablet Take 0.5-1 tablets (25-50 mg total) by mouth every 2 (two) hours as needed for migraine. May repeat after 2 hours.  Do not exceed 200 mg in 24 hours. Patient not taking: Reported on 03/25/2018 10/12/16   Ivar Drape D, PA    Allergies    Patient has no known allergies.  Review of Systems   Review of Systems  All other systems are reviewed and are negative for acute change except as noted in the HPI.   Physical Exam Updated Vital Signs BP (!) 155/82 (BP Location: Right Arm)   Pulse 77   Temp 98.2 F (36.8 C) (Oral)   Resp 16   Ht 5' 10.5" (1.791 m)   Wt 86.2 kg   SpO2 100%   BMI 26.88 kg/m   Physical Exam Vitals and nursing note reviewed.  Constitutional:      Appearance: He is well-developed. He is not ill-appearing or toxic-appearing.  HENT:     Head: Normocephalic and atraumatic.     Nose: Nose normal.  Eyes:     General: No scleral icterus.       Right eye: No discharge.        Left eye: No discharge.     Conjunctiva/sclera:  Conjunctivae normal.  Neck:     Vascular: No JVD.  Cardiovascular:     Rate and Rhythm: Normal rate and regular rhythm.     Pulses: Normal pulses.     Heart sounds: Normal heart sounds.  Pulmonary:     Effort: Pulmonary effort is normal.     Breath sounds: Normal breath sounds.  Abdominal:     General: There is no distension.  Genitourinary:    Penis: Uncircumcised.      Comments: Presenter, broadcasting present for exam. Milk discharge present. No urethritis noted. No signs of sores or lesions or erythema on the penis or testicles. The penis and testicles are nontender. No testicular masses or swelling. No scrotal swelling. No signs of any inguinal hernias. Cremaster reflex present bilaterally.   Musculoskeletal:        General:  Normal range of motion.     Cervical back: Normal range of motion.  Skin:    General: Skin is warm and dry.  Neurological:     Mental Status: He is oriented to person, place, and time.     GCS: GCS eye subscore is 4. GCS verbal subscore is 5. GCS motor subscore is 6.     Comments: Fluent speech, no facial droop.  Psychiatric:        Behavior: Behavior normal.     ED Results / Procedures / Treatments   Labs (all labs ordered are listed, but only abnormal results are displayed) Labs Reviewed  URINALYSIS, ROUTINE W REFLEX MICROSCOPIC - Abnormal; Notable for the following components:      Result Value   APPearance CLOUDY (*)    Leukocytes,Ua TRACE (*)    All other components within normal limits  URINALYSIS, MICROSCOPIC (REFLEX) - Abnormal; Notable for the following components:   Bacteria, UA FEW (*)    All other components within normal limits  RPR  HIV ANTIBODY (ROUTINE TESTING W REFLEX)  GC/CHLAMYDIA PROBE AMP ([place]) NOT AT [facility]    EKG None  Radiology No results found.  Procedures Procedures (including critical care time)  Medications Ordered in ED Medications  cefTRIAXone (ROCEPHIN) injection 500 mg (has no administration in time range)  doxycycline (VIBRA-TABS) tablet 100 mg (has no administration in time range)    ED Course  I have reviewed the triage vital signs and the nursing notes.  Pertinent labs & imaging results that were available during my care of the patient were reviewed by me and considered in my medical decision making (see chart for details).    MDM Rules/Calculators/A&P                      Patient is afebrile without abdominal tenderness, abdominal pain or painful bowel movements to indicate prostatitis.  No tenderness to palpation of the testes or epididymis to suggest orchitis or epididymitis.  STD cultures obtained including HIV, syphilis, gonorrhea and chlamydia. Patient to be discharged with instructions to follow up with PCP.  Discussed importance of using protection when sexually active. Pt understands that they have GC/Chlamydia cultures pending and that they will need to inform all sexual partners if results return positive. Patient has been treated prophylactically with Rocephin and discharged with prescription for doxycycline for possible chlamydia infection per CDC guidelines.  Patient advised to take doxycycline until he has a test result.  If chlamydia test is negative he can discontinue medication.   Portions of this note were generated with [employer]. Dictation errors may occur despite  best attempts at proofreading.     Final Clinical Impression(s) / ED Diagnoses Final diagnoses:  Concern about STD in male without diagnosis    Rx / DC Orders ED Discharge Orders         Ordered    doxycycline (VIBRAMYCIN) 100 MG capsule  2 times daily     10/12/19 2303           Flint Melter 10/12/19 2306    Margette Fast, MD 10/13/19 2023

## 2019-10-12 NOTE — ED Triage Notes (Signed)
Pt reports he's been in a 7 year relationship with the same person. He reports noticing penile discharge today

## 2019-10-13 LAB — HIV ANTIBODY (ROUTINE TESTING W REFLEX): HIV Screen 4th Generation wRfx: NONREACTIVE

## 2019-10-13 LAB — RPR: RPR Ser Ql: NONREACTIVE

## 2019-10-14 LAB — GC/CHLAMYDIA PROBE AMP (~~LOC~~) NOT AT ARMC
Chlamydia: NEGATIVE
Neisseria Gonorrhea: POSITIVE — AB

## 2019-12-17 DIAGNOSIS — R369 Urethral discharge, unspecified: Secondary | ICD-10-CM | POA: Diagnosis not present

## 2020-02-23 DIAGNOSIS — S30860A Insect bite (nonvenomous) of lower back and pelvis, initial encounter: Secondary | ICD-10-CM | POA: Diagnosis not present

## 2020-02-23 DIAGNOSIS — W57XXXA Bitten or stung by nonvenomous insect and other nonvenomous arthropods, initial encounter: Secondary | ICD-10-CM | POA: Diagnosis not present

## 2020-11-20 ENCOUNTER — Other Ambulatory Visit: Payer: Self-pay

## 2020-11-20 ENCOUNTER — Encounter (HOSPITAL_COMMUNITY): Payer: Self-pay

## 2020-11-20 ENCOUNTER — Ambulatory Visit (HOSPITAL_COMMUNITY)
Admission: EM | Admit: 2020-11-20 | Discharge: 2020-11-20 | Disposition: A | Discharge status: Home / Self Care | Payer: Medicare Other | Attending: Family Medicine | Admitting: Family Medicine

## 2020-11-20 DIAGNOSIS — N5089 Other specified disorders of the male genital organs: Secondary | ICD-10-CM

## 2020-11-20 MED ORDER — DOXYCYCLINE HYCLATE 100 MG PO TABS: 100.0000 mg | ORAL_TABLET | Freq: Two times a day (BID) | ORAL | 0 refills | Status: AC

## 2020-11-20 MED ORDER — HIBICLENS 4 % EX LIQD: Freq: Every day | CUTANEOUS | 0 refills | Status: AC | PRN

## 2020-11-20 NOTE — ED Triage Notes (Signed)
Pt presents with perineal abscess that he has had on and off and off for past month

## 2020-11-20 NOTE — Discharge Instructions (Addendum)
Use the Hibiclens wash as needed to the area for prevention Take the full course of antibiotics, warm Epsom salt baths as needed Follow-up with [facility] if the area continues to cause problems

## 2020-11-24 NOTE — ED Provider Notes (Signed)
[facility]    CSN: [id number] Arrival date & time: 11/20/20  1711      History   Chief Complaint Chief Complaint  Patient presents with  . Abscess    HPI Ryan [name] is a 50 y.o. male.   Here today with intermittent painful swollen area of perineum the past month or two. States his girlfriend has "popped" the area and gotten pus out several times during this course but it keeps coming back. He has tried washing with cleansers, changing clothes when getting sweaty without relief. Denies active drainage from area, trouble urinating or passing stools, fever, chills, body aches, sweats, abdominal pain.      Past Medical History:  Diagnosis Date  . Allergy   . Anemia   . Bipolar disorder ([place])   . Blood transfusion without reported diagnosis   . Chronic back pain    Lower back injury with coccyx dysfunction; disability.  . Sleep apnea     Patient Active Problem List   Diagnosis Date Noted  . Sleep apnea 01/13/2015  . Anemia 01/13/2015  . History of bipolar disorder 01/13/2015  . Insomnia w/ sleep apnea 01/13/2015    History reviewed. No pertinent surgical history.     Home Medications    Prior to Admission medications   Medication Sig Start Date End Date Taking? Authorizing Provider  chlorhexidine (HIBICLENS) 4 % external liquid Apply topically daily as needed. 11/20/20  Yes Volney American, PA-C  doxycycline (VIBRA-TABS) 100 MG tablet Take 1 tablet (100 mg total) by mouth 2 (two) times daily. 11/20/20  Yes Volney American, PA-C  cyclobenzaprine (FLEXERIL) 10 MG tablet Take 0.5-1 tablets (5-10 mg total) by mouth 2 (two) times daily as needed for muscle spasms. 03/25/18   Horald Pollen, MD  meloxicam (MOBIC) 15 MG tablet Take 1 tablet (15 mg total) by mouth daily. Take only as needed. 03/25/18   Horald Pollen, MD  SUMAtriptan (IMITREX) 50 MG tablet Take 0.5-1 tablets (25-50 mg total) by mouth every 2 (two) hours as needed for  migraine. May repeat after 2 hours.  Do not exceed 200 mg in 24 hours. Patient not taking: Reported on 03/25/2018 10/12/16   Joretta Bachelor, PA    Family History History reviewed. No pertinent family history.  Social History Social History   Tobacco Use  . Smoking status: Former Research scientist (life sciences)  . Smokeless tobacco: Never Used  Vaping Use  . Vaping Use: Never used  Substance Use Topics  . Alcohol use: No    Alcohol/week: 0.0 standard drinks  . Drug use: No     Allergies   Patient has no known allergies.   Review of Systems Review of Systems PER HPI    Physical Exam Triage Vital Signs ED Triage Vitals  Enc Vitals Group     BP 11/20/20 1754 (!) 157/79     Pulse Rate 11/20/20 1754 84     Resp 11/20/20 1754 20     Temp 11/20/20 1754 98.3 F (36.8 C)     Temp src --      SpO2 11/20/20 1754 98 %     Weight --      Height --      Head Circumference --      Peak Flow --      Pain Score 11/20/20 1758 7     Pain Loc --      Pain Edu? --      Excl. in [place]? --  No data found.  Updated Vital Signs BP (!) 157/79   Pulse 84   Temp 98.3 F (36.8 C)   Resp 20   SpO2 98%   Visual Acuity Right Eye Distance:   Left Eye Distance:   Bilateral Distance:    Right Eye Near:   Left Eye Near:    Bilateral Near:     Physical Exam Vitals and nursing note reviewed. Exam conducted with a chaperone present.  Constitutional:      Appearance: Normal appearance.  HENT:     Head: Atraumatic.  Eyes:     Extraocular Movements: Extraocular movements intact.     Conjunctiva/sclera: Conjunctivae normal.  Cardiovascular:     Rate and Rhythm: Normal rate and regular rhythm.  Pulmonary:     Effort: Pulmonary effort is normal.     Breath sounds: Normal breath sounds.  Genitourinary:    Comments: Pea sized firm tender nodule of perineum, no fluctuance, induration, erythema. Not actively draining Musculoskeletal:        General: Normal range of motion.     Cervical back: Normal  range of motion and neck supple.  Skin:    General: Skin is warm and dry.  Neurological:     General: No focal deficit present.     Mental Status: He is oriented to person, place, and time.  Psychiatric:        Mood and Affect: Mood normal.        Thought Content: Thought content normal.        Judgment: Judgment normal.      UC Treatments / Results  Labs (all labs ordered are listed, but only abnormal results are displayed) Labs Reviewed - No data to display  EKG   Radiology No results found.  Procedures Procedures (including critical care time)  Medications Ordered in UC Medications - No data to display  Initial Impression / Assessment and Plan / UC Course  I have reviewed the triage vital signs and the nursing notes.  Pertinent labs & imaging results that were available during my care of the patient were reviewed by me and considered in my medical decision making (see chart for details).     Discussed poor success rate prediction with I and D today given appearance of area, will trial doxycycline, hibiclens, epsom salt soaks, OTC pain relievers and f/u with [facility] for further mgmt if recurring  Final Clinical Impressions(s) / UC Diagnoses   Final diagnoses:  Perineal cyst in male     Discharge Instructions     Use the Hibiclens wash as needed to the area for prevention Take the full course of antibiotics, warm Epsom salt baths as needed Follow-up with [facility] if the area continues to cause problems    ED Prescriptions    Medication Sig Dispense Auth. Provider   doxycycline (VIBRA-TABS) 100 MG tablet Take 1 tablet (100 mg total) by mouth 2 (two) times daily. 14 tablet Volney American, [place]   chlorhexidine (HIBICLENS) 4 % external liquid Apply topically daily as needed. 120 mL Volney American, [place]     PDMP not reviewed this encounter.   Merrie Roof [place], [place] 11/24/20 [phone]

## 2021-12-06 DIAGNOSIS — Z23 Encounter for immunization: Secondary | ICD-10-CM | POA: Diagnosis not present

## 2021-12-06 DIAGNOSIS — S61411A Laceration without foreign body of right hand, initial encounter: Secondary | ICD-10-CM | POA: Diagnosis not present

## 2021-12-06 DIAGNOSIS — Z1211 Encounter for screening for malignant neoplasm of colon: Secondary | ICD-10-CM | POA: Diagnosis not present

## 2021-12-06 DIAGNOSIS — Z13220 Encounter for screening for lipoid disorders: Secondary | ICD-10-CM | POA: Diagnosis not present

## 2021-12-06 DIAGNOSIS — G47419 Narcolepsy without cataplexy: Secondary | ICD-10-CM | POA: Diagnosis not present

## 2021-12-06 DIAGNOSIS — Z0000 Encounter for general adult medical examination without abnormal findings: Secondary | ICD-10-CM | POA: Diagnosis not present

## 2021-12-07 DIAGNOSIS — Z0000 Encounter for general adult medical examination without abnormal findings: Secondary | ICD-10-CM | POA: Diagnosis not present

## 2021-12-07 DIAGNOSIS — Z13220 Encounter for screening for lipoid disorders: Secondary | ICD-10-CM | POA: Diagnosis not present

## 2023-10-23 ENCOUNTER — Encounter: Payer: Self-pay | Admitting: Optometry

## 2023-10-23 ENCOUNTER — Other Ambulatory Visit: Payer: Self-pay | Admitting: Optometry

## 2023-10-23 DIAGNOSIS — D4989 Neoplasm of unspecified behavior of other specified sites: Secondary | ICD-10-CM

## 2023-10-23 DIAGNOSIS — H0589 Other disorders of orbit: Secondary | ICD-10-CM

## 2023-10-25 ENCOUNTER — Ambulatory Visit
Admission: RE | Admit: 2023-10-25 | Discharge: 2023-10-25 | Disposition: A | Discharge status: Home / Self Care | Source: Ambulatory Visit | Attending: Optometry | Admitting: Optometry

## 2023-10-25 DIAGNOSIS — D4989 Neoplasm of unspecified behavior of other specified sites: Secondary | ICD-10-CM

## 2023-10-25 DIAGNOSIS — H0589 Other disorders of orbit: Secondary | ICD-10-CM

## 2023-10-26 ENCOUNTER — Encounter: Payer: Self-pay | Admitting: Optometry

## 2023-10-27 ENCOUNTER — Other Ambulatory Visit: Payer: Self-pay | Admitting: Optometry

## 2023-10-27 DIAGNOSIS — D4989 Neoplasm of unspecified behavior of other specified sites: Secondary | ICD-10-CM

## 2023-11-19 ENCOUNTER — Other Ambulatory Visit

## 2023-11-27 ENCOUNTER — Ambulatory Visit
Admission: RE | Admit: 2023-11-27 | Discharge: 2023-11-27 | Disposition: A | Discharge status: Home / Self Care | Source: Ambulatory Visit | Attending: Optometry | Admitting: Optometry

## 2023-11-27 DIAGNOSIS — D4989 Neoplasm of unspecified behavior of other specified sites: Secondary | ICD-10-CM

## 2023-11-27 MED ORDER — GADOPICLENOL 0.5 MMOL/ML IV SOLN
10.0000 mL | Freq: Once | INTRAVENOUS | Status: AC | PRN
  Administered 2023-11-27: 10 mL via INTRAVENOUS

## 2023-12-26 ENCOUNTER — Other Ambulatory Visit: Payer: Self-pay

## 2023-12-27 LAB — SURGICAL PATHOLOGY
